# Patient Record
Sex: Female | Born: 1980 | Race: Black or African American | Hispanic: No | Marital: Married | Smoking: Never smoker
Health system: Southern US, Community
[De-identification: ages and names within clinical notes are randomized; demographics above are authoritative.]

## PROBLEM LIST (undated history)

## (undated) ENCOUNTER — Inpatient Hospital Stay (HOSPITAL_COMMUNITY): Payer: Self-pay

## (undated) DIAGNOSIS — F419 Anxiety disorder, unspecified: Secondary | ICD-10-CM

## (undated) DIAGNOSIS — O0990 Supervision of high risk pregnancy, unspecified, unspecified trimester: Secondary | ICD-10-CM

## (undated) DIAGNOSIS — O4690 Antepartum hemorrhage, unspecified, unspecified trimester: Secondary | ICD-10-CM

## (undated) DIAGNOSIS — B181 Chronic viral hepatitis B without delta-agent: Principal | ICD-10-CM

## (undated) HISTORY — DX: Chronic viral hepatitis B without delta-agent: B18.1

## (undated) HISTORY — DX: Antepartum hemorrhage, unspecified, unspecified trimester: O46.90

## (undated) HISTORY — DX: Anxiety disorder, unspecified: F41.9

## (undated) HISTORY — DX: Supervision of high risk pregnancy, unspecified, unspecified trimester: O09.90

---

## 2012-04-26 ENCOUNTER — Inpatient Hospital Stay (HOSPITAL_COMMUNITY): Payer: Self-pay

## 2012-04-26 ENCOUNTER — Inpatient Hospital Stay (HOSPITAL_COMMUNITY)
Admission: AD | Admit: 2012-04-26 | Discharge: 2012-04-26 | Disposition: A | Discharge status: Home / Self Care | Payer: Self-pay | Source: Ambulatory Visit | Attending: Obstetrics and Gynecology | Admitting: Obstetrics and Gynecology

## 2012-04-26 ENCOUNTER — Encounter (HOSPITAL_COMMUNITY): Payer: Self-pay | Admitting: *Deleted

## 2012-04-26 DIAGNOSIS — O209 Hemorrhage in early pregnancy, unspecified: Secondary | ICD-10-CM | POA: Insufficient documentation

## 2012-04-26 DIAGNOSIS — O418X90 Other specified disorders of amniotic fluid and membranes, unspecified trimester, not applicable or unspecified: Secondary | ICD-10-CM

## 2012-04-26 LAB — WET PREP, GENITAL
Clue Cells Wet Prep HPF POC: NONE SEEN
Trich, Wet Prep: NONE SEEN
Yeast Wet Prep HPF POC: NONE SEEN

## 2012-04-26 LAB — CBC WITH DIFFERENTIAL/PLATELET
HCT: 34.7 % — ABNORMAL LOW (ref 36.0–46.0)
Hemoglobin: 11.6 g/dL — ABNORMAL LOW (ref 12.0–15.0)
Lymphocytes Relative: 31 % (ref 12–46)
MCHC: 33.4 g/dL (ref 30.0–36.0)
Monocytes Absolute: 0.4 10*3/uL (ref 0.1–1.0)
Monocytes Relative: 7 % (ref 3–12)
Neutro Abs: 3.4 10*3/uL (ref 1.7–7.7)
WBC: 5.8 10*3/uL (ref 4.0–10.5)

## 2012-04-26 LAB — URINALYSIS, ROUTINE W REFLEX MICROSCOPIC
Bilirubin Urine: NEGATIVE
Glucose, UA: NEGATIVE mg/dL
Hgb urine dipstick: NEGATIVE
Ketones, ur: NEGATIVE mg/dL
Leukocytes, UA: NEGATIVE
Nitrite: NEGATIVE
Protein, ur: NEGATIVE mg/dL
Specific Gravity, Urine: 1.01 (ref 1.005–1.030)
Urobilinogen, UA: 0.2 mg/dL (ref 0.0–1.0)
pH: 6.5 (ref 5.0–8.0)

## 2012-04-26 LAB — POCT PREGNANCY, URINE: Preg Test, Ur: POSITIVE — AB

## 2012-04-26 LAB — ABO/RH: ABO/RH(D): AB POS

## 2012-04-26 LAB — HCG, QUANTITATIVE, PREGNANCY: hCG, Beta Chain, Quant, S: 69364 m[IU]/mL — ABNORMAL HIGH

## 2012-04-26 NOTE — MAU Provider Note (Signed)
History     CSN: [id number]  Arrival date and time: 04/26/12 4098   None     Chief Complaint  Patient presents with  . Vaginal Bleeding   HPI Vicki Allen is a 31 y.o. female who presents to MAU with vaginal bleeding. The bleeding started today.  She describes the bleeding as spotting. She denies abdominal pain. She reports nausea but no vomiting. Last pap smear = never. The history was provided by the patient.  OB History    Grav Para Term Preterm Abortions TAB SAB Ect Mult Living   1         0      History reviewed. No pertinent past medical history.  History reviewed. No pertinent past surgical history.  History reviewed. No pertinent family history.  History  Substance Use Topics  . Smoking status: Never Smoker   . Smokeless tobacco: Never Used  . Alcohol Use: No    Allergies: Allergies not on file  No prescriptions prior to admission    Review of Systems  Constitutional: Negative for fever, chills and weight loss.  HENT: Negative for ear pain, nosebleeds, congestion, sore throat and neck pain.   Eyes: Negative for blurred vision, double vision, photophobia and pain.  Respiratory: Negative for cough, shortness of breath and wheezing.   Cardiovascular: Negative for chest pain, palpitations and leg swelling.  Gastrointestinal: Positive for nausea and constipation. Negative for heartburn, vomiting, abdominal pain and diarrhea.  Genitourinary: Negative for dysuria, urgency and frequency.       Vaginal bleeding (spotting)  Musculoskeletal: Negative for myalgias and back pain.  Skin: Negative for itching and rash.  Neurological: Negative for dizziness, sensory change, speech change, seizures, weakness and headaches.  Endo/Heme/Allergies: Does not bruise/bleed easily.  Psychiatric/Behavioral: Negative for depression. The patient is not nervous/anxious.    Blood pressure 108/69, pulse 79, temperature 96.7 F (35.9 C), temperature source Oral, resp. rate 16,  last menstrual period 03/10/2012.  Physical Exam  Nursing note and vitals reviewed. Constitutional: She is oriented to person, place, and time. She appears well-developed and well-nourished. No distress.  HENT:  Head: Normocephalic and atraumatic.  Eyes: EOM are normal.  Neck: Neck supple.  Cardiovascular: Normal rate.   Respiratory: Effort normal.  GI: Soft. There is no tenderness.  Genitourinary:       External genitalia without lesions. Scant blood vaginal vault. Cervix long, closed, no CMT, no adnexal tenderness. Uterus slightly enlarged.  Musculoskeletal: Normal range of motion.  Neurological: She is alert and oriented to person, place, and time.  Skin: Skin is warm and dry.  Psychiatric: She has a normal mood and affect. Her behavior is normal. Judgment and thought content normal.    Results for orders placed during the hospital encounter of 04/26/12 (from the past 24 hour(s))  URINALYSIS, ROUTINE W REFLEX MICROSCOPIC     Status: Normal   Collection Time   04/26/12  9:40 AM      Component Value Range   Color, Urine YELLOW  YELLOW   APPearance CLEAR  CLEAR   Specific Gravity, Urine 1.010  1.005 - 1.030   pH 6.5  5.0 - 8.0   Glucose, UA NEGATIVE  NEGATIVE mg/dL   Hgb urine dipstick NEGATIVE  NEGATIVE   Bilirubin Urine NEGATIVE  NEGATIVE   Ketones, ur NEGATIVE  NEGATIVE mg/dL   Protein, ur NEGATIVE  NEGATIVE mg/dL   Urobilinogen, UA 0.2  0.0 - 1.0 mg/dL   Nitrite NEGATIVE  NEGATIVE   Leukocytes,  UA NEGATIVE  NEGATIVE  CBC WITH DIFFERENTIAL     Status: Abnormal   Collection Time   04/26/12  9:50 AM      Component Value Range   WBC 5.8  4.0 - 10.5 K/uL   RBC 3.92  3.87 - 5.11 MIL/uL   Hemoglobin 11.6 (*) 12.0 - 15.0 g/dL   HCT 16.1 (*) 09.6 - 04.5 %   MCV 88.5  78.0 - 100.0 fL   MCH 29.6  26.0 - 34.0 pg   MCHC 33.4  30.0 - 36.0 g/dL   RDW 40.9 (*) 81.1 - 91.4 %   Platelets 356  150 - 400 K/uL   Neutrophils Relative 59  43 - 77 %   Neutro Abs 3.4  1.7 - 7.7 K/uL    Lymphocytes Relative 31  12 - 46 %   Lymphs Abs 1.8  0.7 - 4.0 K/uL   Monocytes Relative 7  3 - 12 %   Monocytes Absolute 0.4  0.1 - 1.0 K/uL   Eosinophils Relative 2  0 - 5 %   Eosinophils Absolute 0.1  0.0 - 0.7 K/uL   Basophils Relative 1  0 - 1 %   Basophils Absolute 0.0  0.0 - 0.1 K/uL  HCG, QUANTITATIVE, PREGNANCY     Status: Abnormal   Collection Time   04/26/12  9:50 AM      Component Value Range   hCG, Beta Chain, Quant, [place] [address] (*) <5 mIU/mL  POCT PREGNANCY, URINE     Status: Abnormal   Collection Time   04/26/12 10:04 AM      Component Value Range   Preg Test, Ur POSITIVE (*) NEGATIVE  WET PREP, GENITAL     Status: Abnormal   Collection Time   04/26/12 10:17 AM      Component Value Range   Yeast Wet Prep HPF POC NONE SEEN  NONE SEEN   Trich, Wet Prep NONE SEEN  NONE SEEN   Clue Cells Wet Prep HPF POC NONE SEEN  NONE SEEN   WBC, Wet Prep HPF POC FEW (*) NONE SEEN    Procedures   Assessment: 31 y.o. female with vaginal bleeding @ [redacted]w[redacted]d gestation   Viable IUP on ultrasound   [facility]  Plan:  Start parental care   Pelvic rest   Instructions on threatened AB I have reviewed this patient's vital signs, nurses notes, appropriate labs and imaging.   [place], [facility]  Home Medication Instructions [id number]   Printed on:04/26/12 1917  Medication Information                    Multiple Vitamin (MULTIVITAMIN WITH MINERALS) TABS Take 1 tablet by mouth daily.             NEESE,HOPE, RN, FNP, [facility] 04/26/2012, 10:03 AM

## 2012-04-26 NOTE — MAU Note (Signed)
Bleeding since yesterday, +HPT 2 weeks ago.  Denies pain.

## 2012-04-27 LAB — GC/CHLAMYDIA PROBE AMP: GC Probe RNA: NEGATIVE

## 2012-04-27 NOTE — MAU Provider Note (Signed)
Attestation of Attending Supervision of Advanced Practitioner (CNM/NP): Evaluation and management procedures were performed by the Advanced Practitioner under my supervision and collaboration.  I have reviewed the Advanced Practitioner's note and chart, and I agree with the management and plan.  Chirag Krueger 04/27/2012 7:26 AM

## 2013-03-01 ENCOUNTER — Encounter (HOSPITAL_COMMUNITY): Payer: Self-pay | Admitting: *Deleted

## 2014-03-12 ENCOUNTER — Encounter (HOSPITAL_COMMUNITY): Payer: Self-pay | Admitting: *Deleted

## 2015-04-11 LAB — OB RESULTS CONSOLE RUBELLA ANTIBODY, IGM: RUBELLA: IMMUNE

## 2015-04-26 ENCOUNTER — Encounter (HOSPITAL_COMMUNITY): Payer: Self-pay

## 2015-04-26 ENCOUNTER — Ambulatory Visit (HOSPITAL_COMMUNITY)
Admission: RE | Admit: 2015-04-26 | Discharge: 2015-04-26 | Disposition: A | Discharge status: Home / Self Care | Payer: Managed Care, Other (non HMO) | Source: Ambulatory Visit | Attending: Obstetrics and Gynecology | Admitting: Obstetrics and Gynecology

## 2015-04-26 VITALS — BP 130/74 | HR 111 | Wt 124.0 lb

## 2015-04-26 DIAGNOSIS — B181 Chronic viral hepatitis B without delta-agent: Secondary | ICD-10-CM | POA: Diagnosis not present

## 2015-04-26 DIAGNOSIS — O98412 Viral hepatitis complicating pregnancy, second trimester: Secondary | ICD-10-CM | POA: Diagnosis present

## 2015-04-26 DIAGNOSIS — Z3A17 17 weeks gestation of pregnancy: Secondary | ICD-10-CM

## 2015-04-26 NOTE — Progress Notes (Signed)
34 year old, G2P1001, at 17 weeks 2 days gestation by LMP of 12/26/2014 and EDD of 10/02/2015 for consultation about Hepatitis B. She notes that she was diagnosed with this in 2007 and had it with her son who did not get infected. She is without symptoms currently.   PMHX  negative PSHX  negative POBHX  12/14/2012 40 weeks 9# 8oz  M She reported no complications SocHX  No tobacco, alcohol, illicit drugs or history of STD or abnormal paps Medications  PNV 1 orally qD Allergies   NKDA  #1 Chronic Hepatitis B viral infection - Hepatitis B surface Ag positive, Hepatitis BE NR, Hepatitis C negative, Hepatitis B core antibody NR, AST 14, ALT 7 - Pregnancy is generally well-tolerated in women with chronic hepatitis B virus (HBV) infection who do not have advanced liver disease. However, pregnancy is considered to be an immune tolerant state and is associated with high levels of adrenal corticosteroids that may modulate immune response. Thus, the following clinical manifestations may be seen in pregnant women with chronic HBV: The immunological changes during pregnancy and the postpartum period have been associated with hepatitis flares (including hepatic decompensation), although flares with serious clinical sequelae appear to be uncommon. During the postpartum period, flares may be related to immune reconstitution, a situation immunologically analogous to flares that have been described following the withdrawal of corticosteroids in nonpregnant patients with chronic HBV. Predictors of HBV flares during pregnancy have not been established. However, flares appear to be more common in women who are hepatitis B e antigen (HBeAg)-positive. In addition, flares have been associated with HBeAg seroconversion in approximately 12 to 17 percent of patients, a rate similar to what has been described in patients who are not pregnant. The immunologic, metabolic, and hemodynamic changes that occur during pregnancy have the  potential to worsen or unmask underlying liver disease. Although progression to cirrhosis is not expected within such a short time for most patients, decompensation can occur in the setting of a severe flare. Physical examination may reveal findings suggestive of stigmata of chronic liver disease such as palmar erythema, lower extremity edema, and spider angioma. The immunologic changes associated with pregnancy also have the potential to increase HBV viremia; however, most studies have found that HBV DNA levels remain stable during pregnancy. For mothers with chronic HBV, the impact of HBV infection on newborns is not well defined and data are conflicting. Some studies have found possible associations between chronic HBV and gestational diabetes mellitus, increased risk of prematurity, lower birth weight, and antepartum hemorrhage.  - Management - Various factors need to be assessed when determining the management of pregnant women with chronic HBV during pregnancy, including the indications for treatment, the anticipated duration of therapy, the potential adverse effects to the fetus, the risk of developing drug resistance, and the accessibility and cost of the antiviral agents. The health of the mother and fetus must be considered independently when deciding on treatment. Pregnant women with chronic HBV should be managed in conjunction with a hepatologist.  Women who are pregnant - Some women with chronic HBV require antiviral therapy to prevent progression of liver disease (eg, those with immune-active hepatitis), while others can be observed. The decision to initiate therapy while pregnant depends upon the presence or absence of cirrhosis, HBeAg, and hepatitis B e antibody (anti-HBe), as well as the HBV DNA and aminotransferase levels. The indications for antiviral therapy are generally the same as those for patients who are not pregnant; however, some scenarios may  differ. Antiviral therapy is recommended  for most patients with an ALT >2x the upper limit of normal, women without evidence of cirrhosis may choose to defer therapy until after delivery if they have low viral loads and have mild disease activity (eg, aminotransferase levels just above the treatment threshold). Women with high viral loads should initiate therapy in the third trimester, even if the aminotransferase levels are normal, to prevent transmission to their child. Tenofovir disoproxil fumarate is preferred if antiviral therapy is contemplated in pregnant women because of its potency, safety profile, and low risk of resistance.  - Women who are not on antiviral therapy during pregnancy should be monitored closely to evaluate for a flare. Obtain liver biochemical tests every three months during pregnancy and for six months postpartum. HBV DNA should be tested concurrently or when there is ALT elevation. In addition, the HBV DNA should be measured at 26 to 28 weeks to determine if antiviral therapy should be offered to reduce the risk of mother-to-child transmission.  - Breastfeeding - Infants who received hepatitis B immune globulin (HBIG) and the first dose of hepatitis B vaccine at birth can be breastfed. However, it is important that the infant complete the hepatitis B vaccine series. Mothers with chronic hepatitis B who are breastfeeding should also exercise care to prevent bleeding from cracked nipples. Carrier mothers should not participate in donating breast milk. Discussions of breastfeeding and HBV transmission and newborn immunization are found below.  - Transplacental transmission and transmission due to obstetrical procedures are infrequent causes, and breastfeeding does not appear to pose a substantial risk. In addition, the benefit of cesarean delivery in protecting against transmission has not been clearly established. Thus, the obstetrical approach should not be influenced by the HBV status of the mother. The risk of HBV  transmission is rare when maternal HBV DNA is <105 to 106 int. units/mL. Transmission following amniocentesis has been described, but the risk appears to be low, particularly if the mother is HBeAg-negative with a low HBV viral load, and the procedure is done using a 22-gauge needle under continuous guidance . In an illustrative study, women with HBV who underwent amniocentesis had a rate of vertical transmission that did not differ significantly from women with HBV who did not undergo amniocentesis (9 versus 11 percent). The effect of other invasive procedures during pregnancy (eg, chorionic villus sampling, cordocentesis, fetal surgery) on the risk of transmission is unknown. There are limited data that have examined preterm premature rupture of membranes as a risk factor for HBV transmission, and the available data are conflicting. As a result, management of such patients should not differ from that of women with chronic HBV without preterm premature rupture of membranes. The benefit of cesarean delivery in protecting against HBV transmission has not been clearly established in well-conducted controlled trials. Thus, cesarean delivery should not be routinely recommended for carrier mothers for the purpose of reducing HBV transmission.  Women who are HBsAg-positive should have further testing to measure baseline HBeAg, hepatitis B e antibody (anti-HBe), HBV DNA, and aminotransferase levels. Women who have a high HBV DNA (ie, >2x105 int. units/mL or >106 copies/mL), elevated aminotransferase levels, and/or a positive HBeAg should be referred to a hepatologist to see if early initiation of antiviral medications is needed.  #2 Advanced maternal age - patient has received counseling by primary provider who as offered testing  Questions appear answered to her satisfaction. Precautions for the above given. Spent > 1/2 of 35 minute visit face to face  counseling

## 2015-05-12 NOTE — L&D Delivery Note (Signed)
  Delivery Note At 9:35 AM a viable female "Windell MouldingRuth" was delivered via Vaginal,  Precipitous Spontaneous Delivery (Presentation: ;  ) in maternity admission  With the assistance of the MAU RN.   I was called to the MAU and notified of precipitous delivery.     APGAR: 7, 9; weight 7 lb 3.5 oz (3275 g).   Placenta status: Intact, Spontaneous.  Cord: 3 vessels with the following complications: None.  Cord pH: none  Anesthesia: None  Episiotomy: None Lacerations: 2nd degree;Periurethral Suture Repair: 3.0 vicryl Est. Blood Loss (mL): 300  Mom to postpartum.  Baby to Couplet care / Skin to Skin.  Alphonzo SeveranceRachel Holman Bonsignore 09/28/2015, 3:40 PM

## 2015-05-31 ENCOUNTER — Other Ambulatory Visit (HOSPITAL_COMMUNITY): Payer: Self-pay | Admitting: Obstetrics and Gynecology

## 2015-05-31 DIAGNOSIS — Q672 Dolichocephaly: Secondary | ICD-10-CM

## 2015-06-03 ENCOUNTER — Encounter: Payer: Self-pay | Admitting: Infectious Disease

## 2015-06-03 ENCOUNTER — Ambulatory Visit (INDEPENDENT_AMBULATORY_CARE_PROVIDER_SITE_OTHER): Payer: Managed Care, Other (non HMO) | Admitting: Infectious Disease

## 2015-06-03 DIAGNOSIS — B181 Chronic viral hepatitis B without delta-agent: Secondary | ICD-10-CM | POA: Diagnosis not present

## 2015-06-03 DIAGNOSIS — O0991 Supervision of high risk pregnancy, unspecified, first trimester: Secondary | ICD-10-CM | POA: Diagnosis not present

## 2015-06-03 DIAGNOSIS — O0990 Supervision of high risk pregnancy, unspecified, unspecified trimester: Secondary | ICD-10-CM

## 2015-06-03 HISTORY — DX: Chronic viral hepatitis B without delta-agent: B18.1

## 2015-06-03 HISTORY — DX: Supervision of high risk pregnancy, unspecified, unspecified trimester: O09.90

## 2015-06-03 LAB — COMPLETE METABOLIC PANEL WITH GFR
ALBUMIN: 3.8 g/dL (ref 3.6–5.1)
ALT: 9 U/L (ref 6–29)
AST: 13 U/L (ref 10–30)
Alkaline Phosphatase: 47 U/L (ref 33–115)
BILIRUBIN TOTAL: 0.2 mg/dL (ref 0.2–1.2)
BUN: 8 mg/dL (ref 7–25)
CALCIUM: 9.2 mg/dL (ref 8.6–10.2)
CO2: 21 mmol/L (ref 20–31)
CREATININE: 0.52 mg/dL (ref 0.50–1.10)
Chloride: 104 mmol/L (ref 98–110)
GFR, Est Non African American: >89 mL/min (ref >=60)
Glucose, Bld: 88 mg/dL (ref 65–99)
Potassium: 4 mmol/L (ref 3.5–5.3)
Sodium: 137 mmol/L (ref 135–146)
Total Protein: 6.4 g/dL (ref 6.1–8.1)

## 2015-06-03 NOTE — Progress Notes (Signed)
Consult: Chronic hepatitis B without delta agent and without hepatic coma during high risk pregnancy  Requesting Physician: Elane Fritz NP  Subjective:    Patient ID: Vicki Allen, female    DOB: 12-26-1980, 35 y.o.   MRN: [id number]  HPI  35 year old lady from [place] who was diagnosed with Hepatitis B while still in her native country. She is approximately [redacted] weeks pregnant with 2nd child and accompanied by her 52 year old. She delivered 35 year old previously vaginally and without transmission of Hep B to her son.   She is uncertain of how she contracted the virus but given area of the [place] it is highly likely that it was congenitally contracted. She has zero history of IVDU.   Past Medical History  Diagnosis Date  . Chronic hepatitis B without delta agent without cirrhosis (HCC) 06/03/2015    History reviewed. No pertinent past surgical history. No surgery  Family History  Problem Relation Age of Onset  . Hypertension Mother       Social History   Social History  . Marital Status: Married    Spouse Name: N/A  . Number of Children: N/A  . Years of Education: N/A   Social History Main Topics  . Smoking status: Never Smoker   . Smokeless tobacco: Never Used  . Alcohol Use: No  . Drug Use: No  . Sexual Activity: Not Asked   Other Topics Concern  . None   Social History Narrative    No Known Allergies   Current outpatient prescriptions:  Marland Kitchen  Multiple Vitamin (MULTIVITAMIN WITH MINERALS) TABS, Take 1 tablet by mouth daily., Disp: , Rfl:     Review of Systems  Constitutional: Negative for fever, chills, diaphoresis, activity change, appetite change, fatigue and unexpected weight change.  HENT: Negative for congestion, rhinorrhea, sinus pressure, sneezing, sore throat and trouble swallowing.   Eyes: Negative for photophobia and visual disturbance.  Respiratory: Negative for cough, chest tightness, shortness of breath, wheezing and stridor.   Cardiovascular:  Negative for chest pain, palpitations and leg swelling.  Gastrointestinal: Negative for nausea, vomiting, abdominal pain, diarrhea, constipation, blood in stool, abdominal distention and anal bleeding.  Genitourinary: Negative for dysuria, hematuria, flank pain and difficulty urinating.  Musculoskeletal: Negative for myalgias, back pain, joint swelling, arthralgias and gait problem.  Skin: Negative for color change, pallor, rash and wound.  Neurological: Negative for dizziness, tremors, weakness and light-headedness.  Hematological: Negative for adenopathy. Does not bruise/bleed easily.  Psychiatric/Behavioral: Negative for behavioral problems, confusion, sleep disturbance, dysphoric mood, decreased concentration and agitation.       Objective:   Physical Exam  Constitutional: She is oriented to person, place, and time. She appears well-developed and well-nourished. No distress.  HENT:  Head: Normocephalic and atraumatic.  Mouth/Throat: No oropharyngeal exudate.  Eyes: Conjunctivae and EOM are normal. No scleral icterus.  Neck: Normal range of motion. Neck supple.  Cardiovascular: Normal rate and regular rhythm.   Pulmonary/Chest: Effort normal. No respiratory distress. She has no wheezes.  Abdominal: Soft. Bowel sounds are normal. There is no tenderness. There is no rebound.    Musculoskeletal: She exhibits no edema or tenderness.  Neurological: She is alert and oriented to person, place, and time. She exhibits normal muscle tone. Coordination normal.  Skin: Skin is warm and dry. No rash noted. She is not diaphoretic. No erythema. No pallor.  Psychiatric: She has a normal mood and affect. Her behavior is normal. Judgment and thought content normal.  Nursing note  and vitals reviewed.         Assessment & Plan:   Chronic hepatitis B without delta agent with high risk pregnancy  --check Hepatitis B DNA, recheck LFT's hepatitis E antigen, hepatitis E antigen antibody, delta  agent  We will need to first decide if she needs treatment with Tenofovir during THIS pregancy and then monitor chronically to determine whether she will need treatment later on  She will also need to have screening for [facility] in future

## 2015-06-04 LAB — HEPATITIS A ANTIBODY, TOTAL: Hep A Total Ab: REACTIVE — AB

## 2015-06-05 LAB — HEPATITIS B E ANTIBODY: Hepatitis Be Antibody: REACTIVE — AB

## 2015-06-05 LAB — HEPATITIS B E ANTIGEN: HEPATITIS BE ANTIGEN: NONREACTIVE

## 2015-06-06 LAB — HEPATITIS B DNA, ULTRAQUANTITATIVE, PCR
Hepatitis B DNA (Calc): <1.3 Log IU/mL (ref <1.30)
Hepatitis B DNA: <20 IU/mL (ref <20)

## 2015-06-11 LAB — HEPATITIS DELTA VIRUS ANTIGEN: Hepatitis D Antigen: NOT DETECTED

## 2015-06-21 ENCOUNTER — Other Ambulatory Visit (HOSPITAL_COMMUNITY): Payer: Self-pay | Admitting: Obstetrics and Gynecology

## 2015-06-21 ENCOUNTER — Ambulatory Visit (HOSPITAL_COMMUNITY)
Admission: RE | Admit: 2015-06-21 | Discharge: 2015-06-21 | Disposition: A | Discharge status: Home / Self Care | Payer: Managed Care, Other (non HMO) | Source: Ambulatory Visit | Attending: Obstetrics and Gynecology | Admitting: Obstetrics and Gynecology

## 2015-06-21 ENCOUNTER — Encounter (HOSPITAL_COMMUNITY): Payer: Managed Care, Other (non HMO)

## 2015-06-21 DIAGNOSIS — O09522 Supervision of elderly multigravida, second trimester: Secondary | ICD-10-CM

## 2015-06-21 DIAGNOSIS — B1910 Unspecified viral hepatitis B without hepatic coma: Secondary | ICD-10-CM

## 2015-06-21 DIAGNOSIS — O98419 Viral hepatitis complicating pregnancy, unspecified trimester: Secondary | ICD-10-CM

## 2015-06-21 DIAGNOSIS — Q672 Dolichocephaly: Secondary | ICD-10-CM | POA: Diagnosis not present

## 2015-06-21 DIAGNOSIS — O09299 Supervision of pregnancy with other poor reproductive or obstetric history, unspecified trimester: Secondary | ICD-10-CM | POA: Insufficient documentation

## 2015-06-21 DIAGNOSIS — Z3A25 25 weeks gestation of pregnancy: Secondary | ICD-10-CM | POA: Insufficient documentation

## 2015-06-21 DIAGNOSIS — O09292 Supervision of pregnancy with other poor reproductive or obstetric history, second trimester: Secondary | ICD-10-CM

## 2015-06-21 DIAGNOSIS — Z3689 Encounter for other specified antenatal screening: Secondary | ICD-10-CM

## 2015-06-21 DIAGNOSIS — O359XX0 Maternal care for (suspected) fetal abnormality and damage, unspecified, not applicable or unspecified: Secondary | ICD-10-CM | POA: Insufficient documentation

## 2015-06-21 DIAGNOSIS — Z36 Encounter for antenatal screening of mother: Secondary | ICD-10-CM | POA: Diagnosis present

## 2015-07-03 ENCOUNTER — Ambulatory Visit: Payer: Managed Care, Other (non HMO) | Admitting: Infectious Diseases

## 2015-07-10 ENCOUNTER — Encounter: Payer: Self-pay | Admitting: Infectious Disease

## 2015-07-10 ENCOUNTER — Ambulatory Visit (INDEPENDENT_AMBULATORY_CARE_PROVIDER_SITE_OTHER): Payer: Managed Care, Other (non HMO) | Admitting: Infectious Disease

## 2015-07-10 VITALS — BP 104/67 | Temp 97.9°F | Wt 134.0 lb

## 2015-07-10 DIAGNOSIS — B181 Chronic viral hepatitis B without delta-agent: Secondary | ICD-10-CM | POA: Diagnosis not present

## 2015-07-10 DIAGNOSIS — O0993 Supervision of high risk pregnancy, unspecified, third trimester: Secondary | ICD-10-CM | POA: Diagnosis not present

## 2015-07-10 LAB — COMPLETE METABOLIC PANEL WITH GFR
ALBUMIN: 3.6 g/dL (ref 3.6–5.1)
ALK PHOS: 56 U/L (ref 33–115)
ALT: 7 U/L (ref 6–29)
AST: 13 U/L (ref 10–30)
BILIRUBIN TOTAL: 0.3 mg/dL (ref 0.2–1.2)
BUN: 5 mg/dL — AB (ref 7–25)
CO2: 24 mmol/L (ref 20–31)
Calcium: 8.6 mg/dL (ref 8.6–10.2)
Chloride: 104 mmol/L (ref 98–110)
Creat: 0.5 mg/dL (ref 0.50–1.10)
GFR, Est African American: >89 mL/min (ref >=60)
GLUCOSE: 79 mg/dL (ref 65–99)
Potassium: 3.7 mmol/L (ref 3.5–5.3)
SODIUM: 136 mmol/L (ref 135–146)
TOTAL PROTEIN: 6.3 g/dL (ref 6.1–8.1)

## 2015-07-10 LAB — HIV ANTIBODY (ROUTINE TESTING W REFLEX): HIV 1&2 Ab, 4th Generation: NONREACTIVE

## 2015-07-10 NOTE — Progress Notes (Signed)
Chief complaint: followup for HBV infection in pregnant pt   Subjective:    Patient ID: Vicki Allen, female    DOB: 01-10-1981, 35 y.o.   MRN: [id number]  HPI   35 year old lady from [place] who was diagnosed with Hepatitis B while still in her native country. She had been  approximately [redacted] weeks pregnant with 2nd child and accompanied  When I saw her last.  I checked HBV DNA and it was undetectable . Her Hep BE Ag was negative and hep B e ag ab +.  LFT's were normal.   She is without any complaints today and in good spirits.   Past Medical History  Diagnosis Date  . Chronic hepatitis B without delta agent without cirrhosis (HCC) 06/03/2015  . High-risk pregnancy 06/03/2015    No past surgical history on file. No surgery  Family History  Problem Relation Age of Onset  . Hypertension Mother       Social History   Social History  . Marital Status: Married    Spouse Name: N/A  . Number of Children: N/A  . Years of Education: N/A   Social History Main Topics  . Smoking status: Never Smoker   . Smokeless tobacco: Never Used  . Alcohol Use: No  . Drug Use: No  . Sexual Activity: Not on file   Other Topics Concern  . Not on file   Social History Narrative    No Known Allergies   Current outpatient prescriptions:  Marland Kitchen  Multiple Vitamin (MULTIVITAMIN WITH MINERALS) TABS, Take 1 tablet by mouth daily., Disp: , Rfl:     Review of Systems  Constitutional: Negative for fever, chills, diaphoresis, activity change, appetite change, fatigue and unexpected weight change.  HENT: Negative for congestion, rhinorrhea, sinus pressure, sneezing, sore throat and trouble swallowing.   Eyes: Negative for photophobia and visual disturbance.  Respiratory: Negative for cough, chest tightness, shortness of breath, wheezing and stridor.   Cardiovascular: Negative for chest pain, palpitations and leg swelling.  Gastrointestinal: Negative for nausea, vomiting, abdominal pain, diarrhea,  constipation, blood in stool, abdominal distention and anal bleeding.  Genitourinary: Negative for dysuria, hematuria, flank pain and difficulty urinating.  Musculoskeletal: Negative for myalgias, back pain, joint swelling, arthralgias and gait problem.  Skin: Negative for color change, pallor, rash and wound.  Neurological: Negative for dizziness, tremors, weakness and light-headedness.  Hematological: Negative for adenopathy. Does not bruise/bleed easily.  Psychiatric/Behavioral: Negative for behavioral problems, confusion, sleep disturbance, dysphoric mood, decreased concentration and agitation.       Objective:   Physical Exam  Constitutional: She is oriented to person, place, and time. She appears well-developed and well-nourished. No distress.  HENT:  Head: Normocephalic and atraumatic.  Mouth/Throat: No oropharyngeal exudate.  Eyes: Conjunctivae and EOM are normal. No scleral icterus.  Neck: Normal range of motion. Neck supple.  Cardiovascular: Normal rate and regular rhythm.   Pulmonary/Chest: Effort normal. No respiratory distress. She has no wheezes.  Abdominal: Soft. Bowel sounds are normal. There is no tenderness. There is no rebound.    Musculoskeletal: She exhibits no edema or tenderness.  Neurological: She is alert and oriented to person, place, and time. She exhibits normal muscle tone. Coordination normal.  Skin: Skin is warm and dry. No rash noted. She is not diaphoretic. No erythema. No pallor.  Psychiatric: She has a normal mood and affect. Her behavior is normal. Judgment and thought content normal.  Nursing note and vitals reviewed.  Assessment & Plan:   Chronic hepatitis B without delta agent with high risk pregnancy  --check Hepatitis B DNA AGAIN,  recheck LFT's hepatitis Surface  Antigen, surface antibody. I wonder if she has in fact cleared her hep B  She does not look likely to require any treatment during pregnancy  She will followup in  next 2 months

## 2015-07-11 LAB — HEPATITIS B SURF AG CONFIRMATION: HEPATITIS B SURFACE ANTIGEN CONFIRMATION: POSITIVE — AB

## 2015-07-11 LAB — HEPATITIS B SURFACE ANTIBODY,QUALITATIVE: HEP B S AB: NEGATIVE

## 2015-07-11 LAB — HEPATITIS B SURFACE ANTIGEN: Hepatitis B Surface Ag: POSITIVE — AB

## 2015-07-16 LAB — HEPATITIS B DNA, ULTRAQUANTITATIVE, PCR: Hepatitis B DNA: <20 IU/mL (ref <20)

## 2015-09-10 ENCOUNTER — Ambulatory Visit: Payer: Managed Care, Other (non HMO) | Admitting: Infectious Disease

## 2015-09-23 ENCOUNTER — Ambulatory Visit (INDEPENDENT_AMBULATORY_CARE_PROVIDER_SITE_OTHER): Payer: Managed Care, Other (non HMO) | Admitting: Infectious Disease

## 2015-09-23 ENCOUNTER — Encounter: Payer: Self-pay | Admitting: Infectious Disease

## 2015-09-23 VITALS — BP 112/78 | HR 111 | Temp 98.2°F | Wt 147.0 lb

## 2015-09-23 DIAGNOSIS — O0993 Supervision of high risk pregnancy, unspecified, third trimester: Secondary | ICD-10-CM

## 2015-09-23 DIAGNOSIS — O4690 Antepartum hemorrhage, unspecified, unspecified trimester: Secondary | ICD-10-CM

## 2015-09-23 DIAGNOSIS — B181 Chronic viral hepatitis B without delta-agent: Secondary | ICD-10-CM | POA: Diagnosis not present

## 2015-09-23 HISTORY — DX: Antepartum hemorrhage, unspecified, unspecified trimester: O46.90

## 2015-09-23 LAB — CBC WITH DIFFERENTIAL/PLATELET
BASOS ABS: 0 {cells}/uL (ref 0–200)
Basophils Relative: 0 %
EOS PCT: 1 %
Eosinophils Absolute: 72 cells/uL (ref 15–500)
HCT: 35.9 % (ref 35.0–45.0)
HEMOGLOBIN: 12.2 g/dL (ref 11.7–15.5)
LYMPHS ABS: 1656 {cells}/uL (ref 850–3900)
LYMPHS PCT: 23 %
MCH: 33.2 pg — AB (ref 27.0–33.0)
MCHC: 34 g/dL (ref 32.0–36.0)
MCV: 97.6 fL (ref 80.0–100.0)
MPV: 9.5 fL (ref 7.5–12.5)
Monocytes Absolute: 504 cells/uL (ref 200–950)
Monocytes Relative: 7 %
NEUTROS PCT: 69 %
Neutro Abs: 4968 cells/uL (ref 1500–7800)
Platelets: 255 10*3/uL (ref 140–400)
RBC: 3.68 MIL/uL — ABNORMAL LOW (ref 3.80–5.10)
RDW: 14.3 % (ref 11.0–15.0)
WBC: 7.2 10*3/uL (ref 3.8–10.8)

## 2015-09-23 LAB — COMPLETE METABOLIC PANEL WITH GFR
ALBUMIN: 3.4 g/dL — AB (ref 3.6–5.1)
ALK PHOS: 103 U/L (ref 33–115)
ALT: 7 U/L (ref 6–29)
AST: 14 U/L (ref 10–30)
BILIRUBIN TOTAL: 0.3 mg/dL (ref 0.2–1.2)
BUN: 5 mg/dL — AB (ref 7–25)
CALCIUM: 8.8 mg/dL (ref 8.6–10.2)
CO2: 20 mmol/L (ref 20–31)
CREATININE: 0.54 mg/dL (ref 0.50–1.10)
Chloride: 103 mmol/L (ref 98–110)
GFR, Est African American: >89 mL/min (ref >=60)
GFR, Est Non African American: >89 mL/min (ref >=60)
GLUCOSE: 110 mg/dL — AB (ref 65–99)
POTASSIUM: 4 mmol/L (ref 3.5–5.3)
SODIUM: 137 mmol/L (ref 135–146)
TOTAL PROTEIN: 6.1 g/dL (ref 6.1–8.1)

## 2015-09-23 NOTE — Progress Notes (Signed)
Chief complaint: followup for HBV infection in pregnant pt, she is due next week and is now having some small amount of vaginal bleeding   Subjective:    Patient ID: Vicki Allen, female    DOB: 06/15/1980, [age] y.o.   MRN: [id number]  HPI   35 year old lady from [place] who was diagnosed with Hepatitis B while still in her native country. She had been  approximately [redacted] weeks pregnant with 2nd child and accompanied during our first visit.  I checked HBV DNA and it was undetectable . Her Hep BE Ag was negative and hep B e ag ab +.  LFT's were normal.   I have seen her a 2nd time and yet again normal LFT's and HBV DNA <20.   Today she presents for followup with her child estimated date for delivery being only a week away.  She tells us today that within the past few days she has begun to bleed slightly from her vagina. I have asked her to see her Ob/Gyn asap.    Past Medical History  Diagnosis Date  . Chronic hepatitis B without delta agent without cirrhosis (HCC) 06/03/2015  . High-risk pregnancy 06/03/2015  . Vaginal bleeding during pregnancy, antepartum 09/23/2015    No past surgical history on file. No surgery  Family History  Problem Relation Age of Onset  . Hypertension Mother       Social History   Social History  . Marital Status: Married    Spouse Name: N/A  . Number of Children: N/A  . Years of Education: N/A   Social History Main Topics  . Smoking status: Never Smoker   . Smokeless tobacco: Never Used  . Alcohol Use: No  . Drug Use: No  . Sexual Activity: Not Asked   Other Topics Concern  . None   Social History Narrative    No Known Allergies   Current outpatient prescriptions:  Marland Kitchen.  Multiple Vitamin (MULTIVITAMIN WITH MINERALS) TABS, Take 1 tablet by mouth daily., Disp: , Rfl:     Review of Systems  Constitutional: Negative for fever, chills, diaphoresis, activity change, appetite change, fatigue and unexpected weight change.  HENT: Negative  for congestion, rhinorrhea, sinus pressure, sneezing, sore throat and trouble swallowing.   Eyes: Negative for photophobia and visual disturbance.  Respiratory: Negative for cough, chest tightness, shortness of breath, wheezing and stridor.   Cardiovascular: Negative for chest pain, palpitations and leg swelling.  Gastrointestinal: Negative for nausea, vomiting, abdominal pain, diarrhea, constipation, blood in stool, abdominal distention and anal bleeding.  Genitourinary: Negative for dysuria, hematuria, flank pain and difficulty urinating.  Musculoskeletal: Negative for myalgias, back pain, joint swelling, arthralgias and gait problem.  Skin: Negative for color change, pallor, rash and wound.  Neurological: Negative for dizziness, tremors, weakness and light-headedness.  Hematological: Negative for adenopathy. Does not bruise/bleed easily.  Psychiatric/Behavioral: Negative for behavioral problems, confusion, sleep disturbance, dysphoric mood, decreased concentration and agitation.       Objective:   Physical Exam  Constitutional: She is oriented to person, place, and time. She appears well-developed and well-nourished. No distress.  HENT:  Head: Normocephalic and atraumatic.  Mouth/Throat: No oropharyngeal exudate.  Eyes: Conjunctivae and EOM are normal. No scleral icterus.  Neck: Normal range of motion. Neck supple.  Cardiovascular: Normal rate and regular rhythm.   Pulmonary/Chest: Effort normal. No respiratory distress. She has no wheezes.  Abdominal: Soft. Bowel sounds are normal. There is no tenderness. There is no rebound.    Musculoskeletal:  She exhibits no edema or tenderness.  Neurological: She is alert and oriented to person, place, and time. She exhibits normal muscle tone. Coordination normal.  Skin: Skin is warm and dry. No rash noted. She is not diaphoretic. No erythema. No pallor.  Psychiatric: She has a normal mood and affect. Her behavior is normal. Judgment and  thought content normal.  Nursing note and vitals reviewed.         Assessment & Plan:   Chronic hepatitis B without delta agent with high risk pregnancy  --check Hepatitis B DNA  And recheck LFT's --rtc in 6 months   Vaginal bleeding: she needs to be seen by her Obstetrician.

## 2015-09-25 ENCOUNTER — Inpatient Hospital Stay (HOSPITAL_COMMUNITY)
Admission: AD | Admit: 2015-09-25 | Discharge: 2015-09-25 | Disposition: A | Discharge status: Home / Self Care | Payer: Managed Care, Other (non HMO) | Source: Ambulatory Visit | Attending: Obstetrics & Gynecology | Admitting: Obstetrics & Gynecology

## 2015-09-25 ENCOUNTER — Encounter (HOSPITAL_COMMUNITY): Payer: Self-pay | Admitting: *Deleted

## 2015-09-25 DIAGNOSIS — N812 Incomplete uterovaginal prolapse: Secondary | ICD-10-CM

## 2015-09-25 DIAGNOSIS — N814 Uterovaginal prolapse, unspecified: Secondary | ICD-10-CM

## 2015-09-25 NOTE — MAU Provider Note (Signed)
  History   35 y/o G2P1001 @ [redacted] weeks EGA presenting with chief complaint of something coming out of vagina that she noted after she had had a bowel movement.  Also has had some a bloody show.  She denies constipation.    Patient Active Problem List   Diagnosis Date Noted  . Vaginal bleeding during pregnancy, antepartum 09/23/2015  . Chronic hepatitis B without delta agent without cirrhosis (HCC) 06/03/2015  . High-risk pregnancy 06/03/2015    Chief Complaint  Patient presents with  . Vaginal Prolapse   HPI  OB History    Gravida Para Term Preterm AB TAB SAB Ectopic Multiple Living   2 1 1       1       Past Medical History  Diagnosis Date  . Chronic hepatitis B without delta agent without cirrhosis (HCC) 06/03/2015  . High-risk pregnancy 06/03/2015  . Vaginal bleeding during pregnancy, antepartum 09/23/2015    History reviewed. No pertinent past surgical history.  Family History  Problem Relation Age of Onset  . Hypertension Mother     Social History  Substance Use Topics  . Smoking status: Never Smoker   . Smokeless tobacco: Never Used  . Alcohol Use: No    Allergies: No Known Allergies  Prescriptions prior to admission  Medication Sig Dispense Refill Last Dose  . Multiple Vitamin (MULTIVITAMIN WITH MINERALS) TABS Take 1 tablet by mouth daily.   Taking    ROS  Constitutional: Denies fevers/chills Cardiovascular: Denies chest pain or palpitations Pulmonary: Denies coughing or wheezing Gastrointestinal: Denies nausea, vomiting or diarrhea Genitourinary: Denies pelvic pain, unusual vaginal bleeding, unusual vaginal discharge, dysuria, urgency or frequency. +pelvic pressure.  + vaginal spotting.  Musculoskeletal: Denies muscle or joint aches and pain.  Neurology: Denies abnormal sensations such as tingling or numbness.   Physical Exam   Blood pressure 110/76, pulse 101, temperature 97.8 F (36.6 C), temperature source Oral, resp. rate 16, last menstrual period  12/26/2014.  Gen: No acute distress.  Cervix: 3-4/50%/-3. Cervix prolapsing to 0.5cm posterior to hymen with valsalva.   EFM: 140 BL, mod variability, reactive.  TOCO: + irritability.  Blood group AB positive (office results).    Physical Exam  ED Course Patient was monitored for about 2 hours in ED.  Cervix recheck by RN was unchanged She was deemed stable for discharge   Assessment: Cervical prolapse in pregnancy  Plan: Discharge to home with office follow up in 1 week.  Labor precautions.  Discussed pelvic organ and cervical prolapse management, will follow in postpartum period.    Konrad FelixKULWA,Luisfernando Brightwell WAKURU MD.  09/25/2015 1:51 PM

## 2015-09-25 NOTE — MAU Note (Signed)
Pt presented to MAU by EMS, term pregnancy, states something was "coming out" at home.  EMT reports seeing something - ? Uterus/cervix.  Nothing presenting @ time of pt's arrival.  Dr. Sallye OberKulwa on unit.  Pt reports occasional contractions, no LOF, small amount of bleeding.

## 2015-09-26 LAB — HEPATITIS B DNA, ULTRAQUANTITATIVE, PCR
Hepatitis B DNA (Calc): 2.18 Log IU/mL — ABNORMAL HIGH (ref <1.30)
Hepatitis B DNA: 151 IU/mL — ABNORMAL HIGH (ref <20)

## 2015-09-28 ENCOUNTER — Inpatient Hospital Stay (HOSPITAL_COMMUNITY)
Admission: AD | Admit: 2015-09-28 | Discharge: 2015-09-30 | DRG: 774 | Disposition: A | Discharge status: Home / Self Care | Payer: Managed Care, Other (non HMO) | Source: Ambulatory Visit | Attending: Obstetrics and Gynecology | Admitting: Obstetrics and Gynecology

## 2015-09-28 ENCOUNTER — Encounter (HOSPITAL_COMMUNITY): Payer: Self-pay | Admitting: Certified Nurse Midwife

## 2015-09-28 DIAGNOSIS — Z8249 Family history of ischemic heart disease and other diseases of the circulatory system: Secondary | ICD-10-CM | POA: Diagnosis not present

## 2015-09-28 DIAGNOSIS — O9842 Viral hepatitis complicating childbirth: Secondary | ICD-10-CM | POA: Diagnosis present

## 2015-09-28 DIAGNOSIS — O0930 Supervision of pregnancy with insufficient antenatal care, unspecified trimester: Secondary | ICD-10-CM

## 2015-09-28 DIAGNOSIS — B181 Chronic viral hepatitis B without delta-agent: Secondary | ICD-10-CM | POA: Diagnosis present

## 2015-09-28 DIAGNOSIS — O3443 Maternal care for other abnormalities of cervix, third trimester: Secondary | ICD-10-CM | POA: Diagnosis present

## 2015-09-28 DIAGNOSIS — N812 Incomplete uterovaginal prolapse: Secondary | ICD-10-CM | POA: Diagnosis present

## 2015-09-28 DIAGNOSIS — O09529 Supervision of elderly multigravida, unspecified trimester: Secondary | ICD-10-CM

## 2015-09-28 DIAGNOSIS — Z3A39 39 weeks gestation of pregnancy: Secondary | ICD-10-CM

## 2015-09-28 MED ORDER — WITCH HAZEL-GLYCERIN EX PADS: 1.0000 "application " | MEDICATED_PAD | CUTANEOUS | Status: DC | PRN

## 2015-09-28 MED ORDER — LIDOCAINE HCL (PF) 1 % IJ SOLN
INTRAMUSCULAR | Status: AC
  Filled 2015-09-28: qty 30

## 2015-09-28 MED ORDER — SIMETHICONE 80 MG PO CHEW: 80.0000 mg | CHEWABLE_TABLET | ORAL | Status: DC | PRN

## 2015-09-28 MED ORDER — SENNOSIDES-DOCUSATE SODIUM 8.6-50 MG PO TABS
2.0000 | ORAL_TABLET | ORAL | Status: DC
Start: 2015-09-29 — End: 2015-09-30
  Administered 2015-09-29 (×2): 2 via ORAL
  Filled 2015-09-28 (×2): qty 2

## 2015-09-28 MED ORDER — OXYTOCIN 10 UNIT/ML IJ SOLN
INTRAMUSCULAR | Status: AC
  Filled 2015-09-28: qty 1

## 2015-09-28 MED ORDER — COCONUT OIL OIL: 1.0000 "application " | TOPICAL_OIL | Status: DC | PRN

## 2015-09-28 MED ORDER — TETANUS-DIPHTH-ACELL PERTUSSIS 5-2.5-18.5 LF-MCG/0.5 IM SUSP: 0.5000 mL | Freq: Once | INTRAMUSCULAR | Status: DC

## 2015-09-28 MED ORDER — ONDANSETRON HCL 4 MG PO TABS: 4.0000 mg | ORAL_TABLET | ORAL | Status: DC | PRN

## 2015-09-28 MED ORDER — ACETAMINOPHEN 325 MG PO TABS: 650.0000 mg | ORAL_TABLET | ORAL | Status: DC | PRN

## 2015-09-28 MED ORDER — OXYCODONE-ACETAMINOPHEN 5-325 MG PO TABS: 1.0000 | ORAL_TABLET | ORAL | Status: DC | PRN

## 2015-09-28 MED ORDER — IBUPROFEN 600 MG PO TABS
600.0000 mg | ORAL_TABLET | Freq: Four times a day (QID) | ORAL | Status: DC
  Administered 2015-09-28 – 2015-09-30 (×9): 600 mg via ORAL
  Filled 2015-09-28 (×9): qty 1

## 2015-09-28 MED ORDER — DIPHENHYDRAMINE HCL 25 MG PO CAPS
25.0000 mg | ORAL_CAPSULE | Freq: Four times a day (QID) | ORAL | Status: DC | PRN
Start: 2015-09-28 — End: 2015-09-30

## 2015-09-28 MED ORDER — ZOLPIDEM TARTRATE 5 MG PO TABS: 5.0000 mg | ORAL_TABLET | Freq: Every evening | ORAL | Status: DC | PRN

## 2015-09-28 MED ORDER — OXYCODONE-ACETAMINOPHEN 5-325 MG PO TABS: 2.0000 | ORAL_TABLET | ORAL | Status: DC | PRN

## 2015-09-28 MED ORDER — BENZOCAINE-MENTHOL 20-0.5 % EX AERO
1.0000 "application " | INHALATION_SPRAY | CUTANEOUS | Status: DC | PRN
  Administered 2015-09-28: 1 via TOPICAL
  Filled 2015-09-28: qty 56

## 2015-09-28 MED ORDER — DIBUCAINE 1 % RE OINT: 1.0000 "application " | TOPICAL_OINTMENT | RECTAL | Status: DC | PRN

## 2015-09-28 MED ORDER — PRENATAL MULTIVITAMIN CH
1.0000 | ORAL_TABLET | Freq: Every day | ORAL | Status: DC
  Administered 2015-09-28 – 2015-09-30 (×3): 1 via ORAL
  Filled 2015-09-28 (×3): qty 1

## 2015-09-28 MED ORDER — ONDANSETRON HCL 4 MG/2ML IJ SOLN: 4.0000 mg | INTRAMUSCULAR | Status: DC | PRN

## 2015-09-28 NOTE — Lactation Note (Signed)
This note was copied from a baby's chart. Lactation Consultation Note  Patient Name: Vicki Allen Today's Date: 09/28/2015 Reason for consult: Initial assessment  Baby 8 hours old. Mom nursing baby when this LC entered the room. Mom reports that she nursed her first child for 2 years without any issues. Mom holding baby in a modified cradle position. Baby maintaining a deep latch for over 20 minutes. Enc mom to continue to nurse with cues. [facility], aware of OP/BFSG and LC phone line assistance after D/C. Enc mom to call for assistance as needed.  Maternal Data Has patient been taught Hand Expression?: Yes Does the patient have breastfeeding experience prior to this delivery?: Yes  Feeding Feeding Type: Breast Fed  LATCH Score/Interventions                      Lactation Tools Discussed/Used     Consult Status Consult Status: PRN    Geralynn OchsWILLIARD, Bobbyjoe Pabst 09/28/2015, 6:27 PM

## 2015-09-28 NOTE — MAU Note (Signed)
Pt comes to MAU in labor. Infant delivered in wheelchair upon arrival.

## 2015-09-28 NOTE — Progress Notes (Addendum)
Patient educated upon admission to mother/baby to not get up without our assistance and fall prevention safety plan/form reviewed and signed. Preferred language is English per patient, offered interpreter, patient refused. Upon entering room at 1hour assessment patient was noted to be up in shower by herself, father of baby in room, holding baby. Checked on mom with Signe ColtMichelle Kahn,RN, helped mother out of shower, patient complaining of dizziness, helped patient to commode, patient began fainting at that time. Emergency assistance called, steady used to help patient back to bed. Reeducated patient about getting up alone. Patients VS WNL, alert and oriented x4, fundus U -1 bleeding small. Made Dr.Stringer aware, no new orders at this time.

## 2015-09-28 NOTE — H&P (Signed)
Vicki Allen is a 35 y.o. female, G2P1001 at 39.3 weeks80, presenting for  .  Patient Active Problem List   Diagnosis Date Noted  . Spontaneous vaginal delivery 09/28/2015  . Late prenatal care 09/28/2015  . AMA (advanced maternal age) multigravida 35+ 09/28/2015  . Vaginal bleeding during pregnancy, antepartum 09/23/2015  . Chronic hepatitis B without delta agent without cirrhosis (HCC) 06/03/2015  . High-risk pregnancy 06/03/2015    History of present pregnancy: Patient entered care at 15.1 weeks.   EDC of 10/02/14 was established by LMP of 12/26/14.   Anatomy scan:  20.6 weeks  At 05/21/15, with normal findings and an posterior placenta.  SIUP, vertex, posterior placenta-placental edge is 6.1cm from internal os-normal. Fluid is normal - vertical  pocket=3.8cm, cervix is closed.ovaries and adnexas appear wnl. Palate, profile, nasal bone, philtrum, open  hands, 5th digit, feet, heels seen- head shape apprears dolicocephaly .     Additional US evaluations:     06/21/15: MFM US NORMAL ANATOMY AND HEAD SHAPE   Significant prenatal events:   First Trimester:     Late to Prenatal care  Second Trimester: Entered care at 15-16 wks. Declined genetic testing;  Hep B+ status, F/u with MFM  And referral to ID . Per ID no evidence of active disease, may have cleared the virus;  Hx of LGA infant,early Glucola, Wnl ;  Yeast/ BV present and treated at [facility] Third Trimester:   Normal discomforts of pregnancy including increased pelvic pressure  Last evaluation: 38.1 wks on 09/19/15  S. DevaneJohnson, CNM  FH 38cm, Vertex,  BPM 140 ,  + FM,     1/30/-3;  BP 80/52 ;  wtg 147.5 lbs    OB History    Gravida Para Term Preterm AB TAB SAB Ectopic Multiple Living   2 2 2       0 1     12/14/12   M, 9lbs 8oz, NSVD  Past Medical History  Diagnosis Date  . Chronic hepatitis B without delta agent without cirrhosis (HCC) 06/03/2015  . High-risk pregnancy 06/03/2015  . Vaginal bleeding during pregnancy,  antepartum 09/23/2015   History reviewed. No pertinent past surgical history. Family History: family history includes Hypertension in her mother. Social History:  reports that she has never smoked. She has never used smokeless tobacco. She reports that she does not drink alcohol or use illicit drugs.  Country [place] Origin, [place], Ethnicity, African;   4 year college education, homemaker, catholic, married, heterosexual   Prenatal Transfer Tool  Maternal Diabetes: No Genetic Screening: Declined Maternal Ultrasounds/Referrals: Normal Fetal Ultrasounds or other Referrals:  None, Referred to Materal Fetal Medicine      Hep BE antigen, Hep B core antibody, SEE BY MFM AND ID CLINIC. ADDITIONAL HEPATITIS TESTING NEGATIVE, AND LFTS WNL. FOLLOWED AT ID CLINIC, NEXT CHECK IN 09/2015. NO NEED FOR ANY TREATMENT. ID WILL F/U 09/2015.  Maternal Substance Abuse:  No Significant Maternal Medications:  None Significant Maternal Lab Results: Lab values include: Group B Strep negative  TDAP 07/11/15 Flu    04/22/15  ROS:  All system reviewed and Wnl expect where indicated in HPI  No Known Allergies     Blood pressure 103/60, pulse 100, temperature 98.6 F (37 C), temperature source Oral, resp. rate 16, height 5\' 6"  (1.676 m), weight 67.132 kg (148 lb), last menstrual period 12/26/2014, SpO2 100 %, unknown if currently breastfeeding.  Chest clear Heart RRR without murmur Abd gravid, NT, FH 39 Pelvic: proven to 9lbs 8oz  Ext: wnl   Prenatal labs: ABO, Rh:      AB  positive Antibody:      negative Rubella:  !Error!    Immune RPR:     NR  07/23/15 HBsAg: POSITIVE (03/01 1406)  HIV: NONREACTIVE (03/01 1406)  GBS:    Negative   Sickle cell/Hgb electrophoresis:  AF% Pap:  Wnl  8/17/116 GC:  negative Chlamydia:  negative Genetic screenings:  declined Glucola: wnl Other:    Hep C negative Hgb 11.9  at [facility],  at 28 weeks    Assessment/Plan: IUP at 39.3 Precipitous spontaneous vagina delivery in  Maternity Admissions Unit AMA  Late to prenatal care Positive Viral Hepatitis B  GBS negative  Plan: Admit to Postpartum per consult with Dr. Stefano Gaul Routine [facility] orders    Beatrix Fetters, [place] 09/28/2015, 4:56 PM

## 2015-09-28 NOTE — MAU Provider Note (Signed)
A:  [name] is a 35 y.o. female 82P2001 @ 7332w3d here with labor. The patient was noted to be screaming in the lobby of MAU; I assisted the RN and patient to room 2, SVD of a viable female infant in the wheelchair (delivery time [phone]). Baby was placed immediately on mom for skin-skin care. Baby appearing stunned initially despite stimulation. Baby taken by nursing staff to radiant warmer; NICU called for fetal assessment. Apgars 7,9  Prenatal course unknown, [place]'s midwife called for delivery.  Language barrier present.   Pitocin 40 units IM given Alphonzo Severanceachel Stall CNM at bedside.    Duane LopeJennifer I Rasch, NP 09/28/2015 11:31 AM

## 2015-09-28 NOTE — Progress Notes (Signed)
Vicki Allen, CNM notified that pt delivered in MAU and she needed to come to MAU Room 2.

## 2015-09-29 LAB — CBC
HEMATOCRIT: 28.6 % — AB (ref 36.0–46.0)
HEMOGLOBIN: 9.9 g/dL — AB (ref 12.0–15.0)
MCH: 32.8 pg (ref 26.0–34.0)
MCHC: 34.6 g/dL (ref 30.0–36.0)
MCV: 94.7 fL (ref 78.0–100.0)
Platelets: 223 10*3/uL (ref 150–400)
RBC: 3.02 MIL/uL — AB (ref 3.87–5.11)
RDW: 13.6 % (ref 11.5–15.5)
WBC: 13.8 10*3/uL — AB (ref 4.0–10.5)

## 2015-09-29 LAB — RPR: RPR Ser Ql: NONREACTIVE

## 2015-09-29 MED ORDER — FERROUS SULFATE 325 (65 FE) MG PO TABS
325.0000 mg | ORAL_TABLET | Freq: Every day | ORAL | Status: DC
  Administered 2015-09-30: 325 mg via ORAL
  Filled 2015-09-29 (×2): qty 1

## 2015-09-29 NOTE — Progress Notes (Signed)
Subjective: Postpartum Day 1: Vaginal delivery,2nd degree, periurethral laceration Patient up ad lib, reports no syncope or dizziness. Pain well controlled on motrin Feeding: Breast  Contraceptive plan: undecided  Objective: Vital signs in last 24 hours: Temp:  [97.6 F (36.4 C)-98.6 F (37 C)] 98 F (36.7 C) (05/21 0600) Pulse Rate:  [80-100] 85 (05/21 0600) Resp:  [16-18] 18 (05/21 0600) BP: (95-116)/(59-64) 95/59 mmHg (05/21 0600) SpO2:  [98 %-100 %] 100 % (05/21 0600) Weight:  [67.132 kg (148 lb)] 67.132 kg (148 lb) (05/20 1133)  Physical Exam:  General: alert and cooperative Lochia: appropriate Uterine Fundus: firm Perineum: healing well DVT Evaluation: No evidence of DVT seen on physical exam.   CBC Latest Ref Rng 09/29/2015 09/23/2015 04/26/2012  WBC 4.0 - 10.5 K/uL 13.8(H) 7.2 5.8  Hemoglobin 12.0 - 15.0 g/dL 6.5(H9.9(L) 84.612.2 11.6(L)  Hematocrit 36.0 - 46.0 % 28.6(L) 35.9 34.7(L)  Platelets 150 - 400 K/uL 223 255 356     Assessment/Plan: Status post vaginal delivery day 1. Stable Continue current care. Breastfeeding   Beatrix FettersRachel StallCNM 09/29/2015, 11:02 AM

## 2015-09-30 MED ORDER — IBUPROFEN 600 MG PO TABS
600.0000 mg | ORAL_TABLET | Freq: Four times a day (QID) | ORAL | Status: DC
Start: 2015-09-30 — End: 2016-05-13

## 2015-09-30 NOTE — Discharge Summary (Signed)
Obstetric Discharge Summary Reason for Admission: onset of labor Prenatal Procedures: ultrasound Intrapartum Procedures: spontaneous vaginal delivery Postpartum Procedures: none Complications-Operative and Postpartum: none HEMOGLOBIN  Date Value Ref Range Status  09/29/2015 9.9* 12.0 - 15.0 g/dL Final   HCT  Date Value Ref Range Status  09/29/2015 28.6* 36.0 - 46.0 % Final    Physical Exam:  General: alert and cooperative Lochia: appropriate Uterine Fundus: firm Incision: na DVT Evaluation: No evidence of DVT seen on physical exam.  Discharge Diagnoses: Term Pregnancy-delivered by Alphonzo Severanceachel Stall CNM  By SVD.  Pts pregnancy complicated by chronic Hepatitis B and AMA  Discharge Information: Date: 09/30/2015 Activity: pelvic rest Diet: routine Medications: PNV and Ibuprofen Condition: stable Instructions: refer to practice specific booklet Discharge to: home Follow-up Information    [facility] Schedule an appointment as soon as possible for a visit in 6 weeks.   Specialty:  Obstetrics and Gynecology   Contact information:   [address] [place] [phone]      Newborn Data: Live born female  Birth Weight: 7 lb 3.5 oz (3275 g) APGAR: 7, 9  Home with mother.  Chanler Mendonca A 09/30/2015, 1:11 PM

## 2015-09-30 NOTE — Lactation Note (Signed)
This note was copied from a baby's chart. Lactation Consultation Note  Patient Name: Vicki Allen Today's Date: 09/30/2015 Reason for consult: Follow-up assessment Mom reports some mild nipple tenderness, positional stripes noted bilateral, no cracking or bleeding. Assisted Mom with latch at this visit for baby to obtain more depth. Mom reported less discomfort. Advised to apply EBM to sore nipples, can use coconut oil if needed. Engorgement care reviewed if needed. Continue to BF with feeding ques. Advised of OP services and support group. Encouraged to call for questions/concerns.   Maternal Data    Feeding Feeding Type: Breast Fed Length of feed: 30 min  LATCH Score/Interventions Latch: Grasps breast easily, tongue down, lips flanged, rhythmical sucking. Intervention(s): Adjust position;Assist with latch;Breast massage;Breast compression  Audible Swallowing: A few with stimulation  Type of Nipple: Everted at rest and after stimulation  Comfort (Breast/Nipple): Filling, red/small blisters or bruises, mild/mod discomfort  Problem noted: Mild/Moderate discomfort (positional stripes bilateral) Interventions (Mild/moderate discomfort): Hand massage;Hand expression (EBM for nipple tenderness)  Hold (Positioning): Assistance needed to correctly position infant at breast and maintain latch. Intervention(s): Support Pillows;Position options;Breastfeeding basics reviewed  LATCH Score: 7  Lactation Tools Discussed/Used     Consult Status Consult Status: Complete Date: 09/30/15 Follow-up type: In-patient    Alfred LevinsGranger, Harman Ferrin Ann 09/30/2015, 9:57 AM

## 2016-03-25 ENCOUNTER — Ambulatory Visit: Payer: Managed Care, Other (non HMO) | Admitting: Infectious Disease

## 2016-05-13 ENCOUNTER — Ambulatory Visit (INDEPENDENT_AMBULATORY_CARE_PROVIDER_SITE_OTHER): Payer: Managed Care, Other (non HMO) | Admitting: Infectious Disease

## 2016-05-13 ENCOUNTER — Encounter: Payer: Self-pay | Admitting: Infectious Disease

## 2016-05-13 DIAGNOSIS — B181 Chronic viral hepatitis B without delta-agent: Secondary | ICD-10-CM | POA: Diagnosis not present

## 2016-05-13 NOTE — Progress Notes (Signed)
Chief complaint: followup for HBV infection in previously pregnant patient  Subjective:    Patient ID: Vicki Allen, female    DOB: 01/26/1981, 36 y.o.   MRN: [id number]  HPI  36 year old lady from [place] who was diagnosed with Hepatitis B while still in her native country.We had evaluated her when she was pregnant with her second child. She was hepatitis B B e ag ab +.  LFT's were normal, and hepatitis B DNA was low consistently.  He has no symptoms that were consistent with liver disease at present but we will recheck her liver function tests hep B DNA and rescreen her for HIV.  He will need screening for hepatocellular cancer given her chronic hepatitis B at least when she reaches the age of 36    Past Medical History:  Diagnosis Date  . Chronic hepatitis B without delta agent without cirrhosis (HCC) 06/03/2015  . High-risk pregnancy 06/03/2015  . Vaginal bleeding during pregnancy, antepartum 09/23/2015    No past surgical history on file. No surgery  Family History  Problem Relation Age of Onset  . Hypertension Mother       Social History   Social History  . Marital status: Married    Spouse name: N/A  . Number of children: N/A  . Years of education: N/A   Social History Main Topics  . Smoking status: Never Smoker  . Smokeless tobacco: Never Used  . Alcohol use No  . Drug use: No  . Sexual activity: Not Asked   Other Topics Concern  . None   Social History Narrative  . None    No Known Allergies  No current outpatient prescriptions on file.    Review of Systems  Constitutional: Negative for activity change, appetite change, chills, diaphoresis, fatigue, fever and unexpected weight change.  HENT: Negative for congestion, rhinorrhea, sinus pressure, sneezing, sore throat and trouble swallowing.   Eyes: Negative for photophobia and visual disturbance.  Respiratory: Negative for cough, chest tightness, shortness of breath, wheezing and stridor.     Cardiovascular: Negative for chest pain, palpitations and leg swelling.  Gastrointestinal: Negative for abdominal distention, abdominal pain, anal bleeding, blood in stool, constipation, diarrhea, nausea and vomiting.  Genitourinary: Negative for difficulty urinating, dysuria, flank pain and hematuria.  Musculoskeletal: Negative for arthralgias, back pain, gait problem, joint swelling and myalgias.  Skin: Negative for color change, pallor, rash and wound.  Neurological: Negative for dizziness, tremors, weakness and light-headedness.  Hematological: Negative for adenopathy. Does not bruise/bleed easily.  Psychiatric/Behavioral: Negative for agitation, behavioral problems, confusion, decreased concentration, dysphoric mood and sleep disturbance.       Objective:   Physical Exam  Constitutional: She is oriented to person, place, and time. She appears well-developed and well-nourished. No distress.  HENT:  Head: Normocephalic and atraumatic.  Mouth/Throat: No oropharyngeal exudate.  Eyes: Conjunctivae and EOM are normal. No scleral icterus.  Neck: Normal range of motion. Neck supple.  Cardiovascular: Normal rate and regular rhythm.   Pulmonary/Chest: Effort normal. No respiratory distress. She has no wheezes.  Abdominal: Soft. Bowel sounds are normal. There is no tenderness. There is no rebound.  Musculoskeletal: She exhibits no edema or tenderness.  Neurological: She is alert and oriented to person, place, and time. She exhibits normal muscle tone. Coordination normal.  Skin: Skin is warm and dry. No rash noted. She is not diaphoretic. No erythema. No pallor.  Psychiatric: She has a normal mood and affect. Her behavior is normal. Judgment and thought content  normal.  Nursing note and vitals reviewed.         Assessment & Plan:   Chronic hepatitis B without delta agent   --check Hepatitis B DNA  And recheck LFT's and HIV --rtc in 1 year

## 2016-05-14 LAB — COMPLETE METABOLIC PANEL WITH GFR
ALT: 13 U/L (ref 6–29)
AST: 17 U/L (ref 10–30)
Albumin: 4.5 g/dL (ref 3.6–5.1)
Alkaline Phosphatase: 65 U/L (ref 33–115)
BUN: 10 mg/dL (ref 7–25)
CHLORIDE: 108 mmol/L (ref 98–110)
CO2: 22 mmol/L (ref 20–31)
Calcium: 9.3 mg/dL (ref 8.6–10.2)
Creat: 0.66 mg/dL (ref 0.50–1.10)
Glucose, Bld: 90 mg/dL (ref 65–99)
Potassium: 4 mmol/L (ref 3.5–5.3)
Sodium: 142 mmol/L (ref 135–146)
Total Bilirubin: 0.4 mg/dL (ref 0.2–1.2)
Total Protein: 6.8 g/dL (ref 6.1–8.1)

## 2016-05-14 LAB — HIV ANTIBODY (ROUTINE TESTING W REFLEX): HIV: NONREACTIVE

## 2016-05-18 LAB — HEPATITIS B DNA, ULTRAQUANTITATIVE, PCR
HEPATITIS B DNA (CALC): 3.01 {Log_IU}/mL — AB (ref <1.30)
Hepatitis B DNA: 1013 IU/mL — ABNORMAL HIGH (ref <20)

## 2017-04-22 LAB — OB RESULTS CONSOLE RPR: RPR: NONREACTIVE

## 2017-04-22 LAB — OB RESULTS CONSOLE HEPATITIS B SURFACE ANTIGEN: HEP B S AG: POSITIVE

## 2017-04-22 LAB — OB RESULTS CONSOLE ABO/RH: RH TYPE: POSITIVE

## 2017-04-22 LAB — OB RESULTS CONSOLE ANTIBODY SCREEN: ANTIBODY SCREEN: NEGATIVE

## 2017-04-22 LAB — OB RESULTS CONSOLE GC/CHLAMYDIA
Chlamydia: NEGATIVE
GC PROBE AMP, GENITAL: NEGATIVE

## 2017-04-22 LAB — OB RESULTS CONSOLE RUBELLA ANTIBODY, IGM: RUBELLA: IMMUNE

## 2017-04-22 LAB — OB RESULTS CONSOLE HIV ANTIBODY (ROUTINE TESTING): HIV: NONREACTIVE

## 2017-05-11 NOTE — L&D Delivery Note (Signed)
Delivery Note Pt became complete at 1824. She had SROM for clear fluid when the bag was at the introitus, at 1831, followed at 6:34 PM by a viable female was delivered via Vaginal, Spontaneous (Presentation: LOA).  APGAR: 8, 9; weight: pending .  Tight nuchal x 1, reduced only as shoulders were delivering. Infant dried and placed on pt's abd; cord clamped and cut by CNM. Hospital cord blood sample collected. Placenta status: spont, intact .  Cord: 3 vessel  Anesthesia:  None   Episiotomy: None Lacerations: None Est. Blood Loss (mL): 250  Mom to postpartum.  Baby to Couplet care / Skin to Skin.  Amp just finished infusing as baby delivered, but membranes were only ruptured x 3 mins prior to birth.  Cam HaiSHAW, Corena Tilson CNM 10/15/2017, 7:12 PM

## 2017-09-17 LAB — OB RESULTS CONSOLE GC/CHLAMYDIA
Chlamydia: NEGATIVE
Gonorrhea: NEGATIVE

## 2017-09-17 LAB — OB RESULTS CONSOLE GBS: STREP GROUP B AG: POSITIVE

## 2017-09-27 ENCOUNTER — Other Ambulatory Visit: Payer: Self-pay | Admitting: Obstetrics & Gynecology

## 2017-10-15 ENCOUNTER — Inpatient Hospital Stay (HOSPITAL_COMMUNITY)
Admission: AD | Admit: 2017-10-15 | Discharge: 2017-10-17 | DRG: 806 | Disposition: A | Discharge status: Home / Self Care | Payer: Medicaid Other | Attending: Obstetrics & Gynecology | Admitting: Obstetrics & Gynecology

## 2017-10-15 ENCOUNTER — Encounter (HOSPITAL_COMMUNITY): Payer: Self-pay | Admitting: *Deleted

## 2017-10-15 ENCOUNTER — Other Ambulatory Visit: Payer: Self-pay

## 2017-10-15 DIAGNOSIS — B181 Chronic viral hepatitis B without delta-agent: Secondary | ICD-10-CM | POA: Diagnosis present

## 2017-10-15 DIAGNOSIS — Z3A40 40 weeks gestation of pregnancy: Secondary | ICD-10-CM

## 2017-10-15 DIAGNOSIS — O9842 Viral hepatitis complicating childbirth: Secondary | ICD-10-CM | POA: Diagnosis present

## 2017-10-15 DIAGNOSIS — Z3483 Encounter for supervision of other normal pregnancy, third trimester: Secondary | ICD-10-CM | POA: Diagnosis present

## 2017-10-15 DIAGNOSIS — O99824 Streptococcus B carrier state complicating childbirth: Secondary | ICD-10-CM

## 2017-10-15 LAB — CBC
HEMATOCRIT: 37.5 % (ref 36.0–46.0)
Hemoglobin: 13.4 g/dL (ref 12.0–15.0)
MCH: 34.7 pg — ABNORMAL HIGH (ref 26.0–34.0)
MCHC: 35.7 g/dL (ref 30.0–36.0)
MCV: 97.2 fL (ref 78.0–100.0)
PLATELETS: 254 10*3/uL (ref 150–400)
RBC: 3.86 MIL/uL — ABNORMAL LOW (ref 3.87–5.11)
RDW: 13.2 % (ref 11.5–15.5)
WBC: 7.6 10*3/uL (ref 4.0–10.5)

## 2017-10-15 MED ORDER — SOD CITRATE-CITRIC ACID 500-334 MG/5ML PO SOLN: 30.0000 mL | ORAL | Status: DC | PRN

## 2017-10-15 MED ORDER — ACETAMINOPHEN 325 MG PO TABS: 650.0000 mg | ORAL_TABLET | ORAL | Status: DC | PRN

## 2017-10-15 MED ORDER — WITCH HAZEL-GLYCERIN EX PADS: 1.0000 "application " | MEDICATED_PAD | CUTANEOUS | Status: DC | PRN

## 2017-10-15 MED ORDER — SENNOSIDES-DOCUSATE SODIUM 8.6-50 MG PO TABS
2.0000 | ORAL_TABLET | ORAL | Status: DC
  Administered 2017-10-15 – 2017-10-16 (×2): 2 via ORAL
  Filled 2017-10-15 (×2): qty 2

## 2017-10-15 MED ORDER — FENTANYL CITRATE (PF) 100 MCG/2ML IJ SOLN: 100.0000 ug | INTRAMUSCULAR | Status: DC | PRN

## 2017-10-15 MED ORDER — OXYCODONE-ACETAMINOPHEN 5-325 MG PO TABS: 2.0000 | ORAL_TABLET | ORAL | Status: DC | PRN

## 2017-10-15 MED ORDER — OXYTOCIN BOLUS FROM INFUSION
500.0000 mL | Freq: Once | INTRAVENOUS | Status: AC
  Administered 2017-10-15: 500 mL via INTRAVENOUS

## 2017-10-15 MED ORDER — LIDOCAINE HCL (PF) 1 % IJ SOLN
30.0000 mL | INTRAMUSCULAR | Status: DC | PRN
  Filled 2017-10-15: qty 30

## 2017-10-15 MED ORDER — SIMETHICONE 80 MG PO CHEW: 80.0000 mg | CHEWABLE_TABLET | ORAL | Status: DC | PRN

## 2017-10-15 MED ORDER — ONDANSETRON HCL 4 MG/2ML IJ SOLN: 4.0000 mg | Freq: Four times a day (QID) | INTRAMUSCULAR | Status: DC | PRN

## 2017-10-15 MED ORDER — COCONUT OIL OIL: 1.0000 "application " | TOPICAL_OIL | Status: DC | PRN

## 2017-10-15 MED ORDER — DIBUCAINE 1 % RE OINT: 1.0000 "application " | TOPICAL_OINTMENT | RECTAL | Status: DC | PRN

## 2017-10-15 MED ORDER — IBUPROFEN 600 MG PO TABS
600.0000 mg | ORAL_TABLET | Freq: Four times a day (QID) | ORAL | Status: DC
  Administered 2017-10-15 – 2017-10-17 (×8): 600 mg via ORAL
  Filled 2017-10-15 (×8): qty 1

## 2017-10-15 MED ORDER — LACTATED RINGERS IV SOLN
INTRAVENOUS | Status: DC
  Administered 2017-10-15: 19:00:00 via INTRAVENOUS

## 2017-10-15 MED ORDER — BENZOCAINE-MENTHOL 20-0.5 % EX AERO: 1.0000 "application " | INHALATION_SPRAY | CUTANEOUS | Status: DC | PRN

## 2017-10-15 MED ORDER — ONDANSETRON HCL 4 MG PO TABS: 4.0000 mg | ORAL_TABLET | ORAL | Status: DC | PRN

## 2017-10-15 MED ORDER — TETANUS-DIPHTH-ACELL PERTUSSIS 5-2.5-18.5 LF-MCG/0.5 IM SUSP: 0.5000 mL | Freq: Once | INTRAMUSCULAR | Status: DC

## 2017-10-15 MED ORDER — OXYCODONE-ACETAMINOPHEN 5-325 MG PO TABS: 1.0000 | ORAL_TABLET | ORAL | Status: DC | PRN

## 2017-10-15 MED ORDER — DIPHENHYDRAMINE HCL 25 MG PO CAPS: 25.0000 mg | ORAL_CAPSULE | Freq: Four times a day (QID) | ORAL | Status: DC | PRN

## 2017-10-15 MED ORDER — ZOLPIDEM TARTRATE 5 MG PO TABS: 5.0000 mg | ORAL_TABLET | Freq: Every evening | ORAL | Status: DC | PRN

## 2017-10-15 MED ORDER — OXYTOCIN 40 UNITS IN LACTATED RINGERS INFUSION - SIMPLE MED
2.5000 [IU]/h | INTRAVENOUS | Status: DC
  Filled 2017-10-15: qty 1000

## 2017-10-15 MED ORDER — LACTATED RINGERS IV SOLN: 500.0000 mL | INTRAVENOUS | Status: DC | PRN

## 2017-10-15 MED ORDER — SODIUM CHLORIDE 0.9 % IV SOLN
2.0000 g | Freq: Once | INTRAVENOUS | Status: AC
  Administered 2017-10-15: 2 g via INTRAVENOUS
  Filled 2017-10-15: qty 2

## 2017-10-15 MED ORDER — ONDANSETRON HCL 4 MG/2ML IJ SOLN: 4.0000 mg | INTRAMUSCULAR | Status: DC | PRN

## 2017-10-15 MED ORDER — OXYCODONE HCL 5 MG PO TABS: 5.0000 mg | ORAL_TABLET | ORAL | Status: DC | PRN

## 2017-10-15 MED ORDER — PRENATAL MULTIVITAMIN CH
1.0000 | ORAL_TABLET | Freq: Every day | ORAL | Status: DC
  Administered 2017-10-16 – 2017-10-17 (×2): 1 via ORAL
  Filled 2017-10-15 (×2): qty 1

## 2017-10-15 NOTE — H&P (Signed)
Vicki Allen is a 37 y.o. female G2P1001 @ 40.0wks by LMP and confirmed with 19wk U/S presenting for reg ctx. [facility] and has been remarkable for 1) chronic Hep B pos 2) GBS pos  OB History    Gravida  2   Para  2   Term  2   Preterm      AB      Living  1     SAB      TAB      Ectopic      Multiple  0   Live Births  1          Past Medical History:  Diagnosis Date  . Chronic hepatitis B without delta agent without cirrhosis (HCC) 06/03/2015  . High-risk pregnancy 06/03/2015  . Vaginal bleeding during pregnancy, antepartum 09/23/2015   No past surgical history on file. Family History: family history includes Hypertension in her mother. Social History:  reports that she has never smoked. She has never used smokeless tobacco. She reports that she does not drink alcohol or use drugs.     Maternal Diabetes: No Genetic Screening: Normal Maternal Ultrasounds/Referrals: Normal Fetal Ultrasounds or other Referrals:  None Maternal Substance Abuse:  No Significant Maternal Medications:  None Significant Maternal Lab Results:  Lab values include: Group B Strep positive, HBsAG positive Other Comments:  None  ROS History Dilation: 7 Effacement (%): 100 Station: -1 Exam by:: Weston,RN  Blood pressure 122/77, pulse 80, temperature 98 F (36.7 C), temperature source Oral, resp. rate 20, currently breastfeeding. Exam Physical Exam  Constitutional: She is oriented to person, place, and time. She appears well-developed.  HENT:  Head: Normocephalic.  Neck: Normal range of motion.  Cardiovascular: Normal rate.  Respiratory: Effort normal.  GI:  FHR 120s, +accels, no decels Ctx q 3 mins  Musculoskeletal: Normal range of motion.  Neurological: She is alert and oriented to person, place, and time.  Skin: Skin is warm and dry.  Psychiatric: She has a normal mood and affect. Her behavior is normal. Thought content normal.    Prenatal  labs: (04/22/17) ABO, Rh:  AB pos Antibody:  neg Rubella:  immune RPR:   NR HBsAg:   POSITIVE HIV:   NR GBS:   pos (09/17/17)  Assessment/Plan: IUP@term  Active labor GBS pos  [facility] Amp for GBS ppx Anticipate SVD Plan on appropriate measures for infant with mom's Hep B carrier status   Kimari Coudriet CNM 10/15/2017, 6:10 PM

## 2017-10-16 LAB — RPR: RPR Ser Ql: NONREACTIVE

## 2017-10-16 LAB — TYPE AND SCREEN
ABO/RH(D): AB POS
ANTIBODY SCREEN: NEGATIVE

## 2017-10-16 NOTE — Progress Notes (Signed)
POSTPARTUM PROGRESS NOTE  Post Partum Day 1 Subjective:  Vicki Allen is a 37 y.o. G3P2001 3294w1d s/p SVD.  No acute events overnight.  Pt denies problems with ambulating, voiding or po intake.  She denies nausea or vomiting.  Pain is well controlled.  She has had flatus. She has had bowel movement.  Lochia Minimal. Patient denies CP, HA, dizziness, SOB.  Objective: Blood pressure 98/71, pulse 84, temperature 98.9 F (37.2 C), temperature source Oral, resp. rate 16, height 5\' 5"  (1.651 m), weight 147 lb (66.7 kg), currently breastfeeding.  Physical Exam:  General: alert, cooperative and no distress Lochia:normal flow Chest: CTAB Heart: RRR no m/r/g Abdomen: +BS, soft, nontender,  Uterine Fundus: firm, nontender DVT Evaluation: No calf swelling or tenderness Extremities: No evidence of LE edema  Recent Labs    10/15/17 1821  HGB 13.4  HCT 37.5    Assessment/Plan:  ASSESSMENT: Vicki Allen is a 37 y.o. G3P2001 7994w1d s/p SVD.  Plan for discharge tomorrow Breastfeeding without issue Contraception: IUD Outpatient   LOS: 1 day   Cyndee Brightlyonnor  M Anaih Brander, Medical Student 10/16/2017, 8:47 AM

## 2017-10-16 NOTE — Lactation Note (Signed)
This note was copied from a baby's chart. Lactation Consultation Note  Patient Name: Vicki Allen Today's Date: 10/16/2017 Reason for consult: Initial assessment;Term  P2 mother whose infant is now 2111 hours old.  Baby swaddled and sleeping in bassinet as I arrived.  Mother has no questions/concerns about breastfeeding at this time.  Her breasts are soft and non tender and she has no pain with latching.  I encouraged her to have her RN observe the next latch.    Mother will feed 8-12 times/ 24 hours or earlier if he shows feeding cues.  I encouraged STS, breast massage and hand expression.  Mother verbalized understanding and is familiar with hand expression.  Mom made aware of O/P services, breastfeeding support groups, community resources, and our phone # for post-discharge questions.   No support person present as of now.   Maternal Data Formula Feeding for Exclusion: No Does the patient have breastfeeding experience prior to this delivery?: Yes  Feeding Feeding Type: Breast Fed Length of feed: 30 min  LATCH Score                   Interventions    Lactation Tools Discussed/Used     Consult Status Consult Status: Follow-up Date: 10/16/17 Follow-up type: In-patient    Kayli Beal R Delania Ferg 10/16/2017, 5:44 AM

## 2017-10-17 ENCOUNTER — Other Ambulatory Visit: Payer: Self-pay | Admitting: Obstetrics & Gynecology

## 2017-10-17 MED ORDER — IBUPROFEN 600 MG PO TABS: 600.0000 mg | ORAL_TABLET | Freq: Four times a day (QID) | ORAL | 0 refills | Status: DC

## 2017-10-17 NOTE — Discharge Summary (Signed)
OB Discharge Summary     Patient Name: Vicki Allen DOB: 22-Jul-1980 MRN: [id number]  Date of admission: 10/15/2017 Delivering MD: Cam Hai D   Date of discharge: 10/17/2017  Admitting diagnosis: 40 WKS CTX Intrauterine pregnancy: [redacted]w[redacted]d     Secondary diagnosis:  Active Problems:   Labor and delivery indication for care or intervention  Additional problems: Chronic Hep B-Followed by ID OP     Discharge diagnosis: Term Pregnancy Delivered                                                                                                Post partum procedures:None  Augmentation: None  Complications: None  Hospital course:  Onset of Labor With Vaginal Delivery     37 y.o. yo G3P2001 at [redacted]w[redacted]d was admitted in Active Labor on 10/15/2017. Patient had an uncomplicated labor course as follows:  Membrane Rupture Time/Date: 6:31 PM ,10/15/2017   Intrapartum Procedures: Episiotomy: None [1]                                         Lacerations:  None [1]  Patient had a delivery of a Viable infant. 10/15/2017  Information for the patient's newborn:  Zada Girt [[id number]]  Delivery Method: Vag-Spont    Pateint had an uncomplicated postpartum course.  She is ambulating, tolerating a regular diet, passing flatus, and urinating well. Patient is discharged home in stable condition on 10/19/17.   Physical exam  Vitals:   10/16/17 0238 10/16/17 1023 10/16/17 1627 10/17/17 0541  BP: 98/71 105/79 103/77 110/85  Pulse: 84 78 79 71  Resp: 16 18 18 18   Temp: 98.9 F (37.2 C) 99.9 F (37.7 C) 98.1 F (36.7 C) 97.8 F (36.6 C)  TempSrc: Oral Oral Oral Oral  Weight:      Height:       General: alert, cooperative and no distress Lochia: appropriate Uterine Fundus: firm Incision: N/A DVT Evaluation: No evidence of DVT seen on physical exam. Labs: Lab Results  Component Value Date   WBC 7.6 10/15/2017   HGB 13.4 10/15/2017   HCT 37.5 10/15/2017   MCV 97.2 10/15/2017   PLT 254  10/15/2017   CMP Latest Ref Rng & Units 05/13/2016  Glucose 65 - 99 mg/dL 90  BUN 7 - 25 mg/dL 10  Creatinine 0.86 - 5.78 mg/dL 4.69  Sodium 629 - 528 mmol/L 142  Potassium 3.5 - 5.3 mmol/L 4.0  Chloride 98 - 110 mmol/L 108  CO2 20 - 31 mmol/L 22  Calcium 8.6 - 10.2 mg/dL 9.3  Total Protein 6.1 - 8.1 g/dL 6.8  Total Bilirubin 0.2 - 1.2 mg/dL 0.4  Alkaline Phos 33 - 115 U/L 65  AST 10 - 30 U/L 17  ALT 6 - 29 U/L 13    Discharge instruction: per After Visit Summary and "Baby and Me Booklet".  After visit meds:  Allergies as of 10/17/2017   No Known Allergies     Medication List  TAKE these medications   ibuprofen 600 MG tablet Commonly known as:  ADVIL,MOTRIN Take 1 tablet (600 mg total) by mouth every 6 (six) hours.   PRENATAL VITAMIN PLUS LOW IRON 27-1 MG Tabs TAKE 1 TABLET BY MOUTH EVERY DAY       Diet: routine diet  Activity: Advance as tolerated. Pelvic rest for 6 weeks.   Outpatient follow up:4 weeks Follow up Appt:No future appointments. Follow up Visit:No follow-ups on file.  Postpartum contraception: IUD Mirena  Newborn Data: Live born female  Birth Weight: 8 lb 4.8 oz (3765 g) APGAR: 8, 9  Newborn Delivery   Birth date/time:  10/15/2017 18:34:00 Delivery type:  Vaginal, Spontaneous     Baby Feeding: Breast Disposition:home with mother  F/U w/ ID for Hep B as scheduled.    10/17/2017 Dorathy KinsmanVirginia Regnald Bowens, CNM

## 2017-10-17 NOTE — Lactation Note (Addendum)
This note was copied from a baby's chart. Lactation Consultation Note  Patient Name: Vicki Allen Today's Date: 10/17/2017    P3, Baby 38 hours old.  Ex BF for 2 years with other children. Unwrapped baby to wake for feeding. Mother hand expressed good flow of colostrum prior to latching. Baby latched in side lying on R side.  Sucks and swallows observed. Mom encouraged to feed baby 8-12 times/24 hours and with feeding cues.  Reviewed engorgement care and monitoring voids/stools. Provided mother w/ manual pump and demonstrated use. Returned to room to discuss breastfeeding and Hep B+ but visitors in room. Suggested to RN Kristine LineaKaren Kane when she does discharge teaching that if mother's nipples become cracked or bleeding,  mother should pump until healed due to risk of transmission.      Maternal Data    Feeding    LATCH Score                   Interventions    Lactation Tools Discussed/Used     Consult Status      Vicki Allen, Vicki Allen 10/17/2017, 9:16 AM

## 2017-10-17 NOTE — Discharge Instructions (Signed)
Postpartum Care After Vaginal Delivery °The period of time right after you deliver your newborn is called the postpartum period. °What kind of medical care will I receive? °· You may continue to receive fluids and medicines through an IV tube inserted into one of your veins. °· If an incision was made near your vagina (episiotomy) or if you had some vaginal tearing during delivery, cold compresses may be placed on your episiotomy or your tear. This helps to reduce pain and swelling. °· You may be given a squirt bottle to use when you go to the bathroom. You may use this until you are comfortable wiping as usual. To use the squirt bottle, follow these steps: °? Before you urinate, fill the squirt bottle with warm water. Do not use hot water. °? After you urinate, while you are sitting on the toilet, use the squirt bottle to rinse the area around your urethra and vaginal opening. This rinses away any urine and blood. °? You may do this instead of wiping. As you start healing, you may use the squirt bottle before wiping yourself. Make sure to wipe gently. °? Fill the squirt bottle with clean water every time you use the bathroom. °· You will be given sanitary pads to wear. °How can I expect to feel? °· You may not feel the need to urinate for several hours after delivery. °· You will have some soreness and pain in your abdomen and vagina. °· If you are breastfeeding, you may have uterine contractions every time you breastfeed for up to several weeks postpartum. Uterine contractions help your uterus return to its normal size. °· It is normal to have vaginal bleeding (lochia) after delivery. The amount and appearance of lochia is often similar to a menstrual period in the first week after delivery. It will gradually decrease over the next few weeks to a dry, yellow-brown discharge. For most women, lochia stops completely by 6-8 weeks after delivery. Vaginal bleeding can vary from woman to woman. °· Within the first few  days after delivery, you may have breast engorgement. This is when your breasts feel heavy, full, and uncomfortable. Your breasts may also throb and feel hard, tightly stretched, warm, and tender. After this occurs, you may have milk leaking from your breasts. Your health care provider can help you relieve discomfort due to breast engorgement. Breast engorgement should go away within a few days. °· You may feel more sad or worried than normal due to hormonal changes after delivery. These feelings should not last more than a few days. If these feelings do not go away after several days, speak with your health care provider. °How should I care for myself? °· Tell your health care provider if you have pain or discomfort. °· Drink enough water to keep your urine clear or pale yellow. °· Wash your hands thoroughly with soap and water for at least 20 seconds after changing your sanitary pads, after using the toilet, and before holding or feeding your baby. °· If you are not breastfeeding, avoid touching your breasts a lot. Doing this can make your breasts produce more milk. °· If you become weak or lightheaded, or you feel like you might faint, ask for help before: °? Getting out of bed. °? Showering. °· Change your sanitary pads frequently. Watch for any changes in your flow, such as a sudden increase in volume, a change in color, the passing of large blood clots. If you pass a blood clot from your vagina, save it   to show to your health care provider. Do not flush blood clots down the toilet without having your health care provider look at them. °· Make sure that all your vaccinations are up to date. This can help protect you and your baby from getting certain diseases. You may need to have immunizations done before you leave the hospital. °· If desired, talk with your health care provider about methods of family planning or birth control (contraception). °How can I start bonding with my baby? °Spending as much time as  possible with your baby is very important. During this time, you and your baby can get to know each other and develop a bond. Having your baby stay with you in your room (rooming in) can give you time to get to know your baby. Rooming in can also help you become comfortable caring for your baby. Breastfeeding can also help you bond with your baby. °How can I plan for returning home with my baby? °· Make sure that you have a car seat installed in your vehicle. °? Your car seat should be checked by a certified car seat installer to make sure that it is installed safely. °? Make sure that your baby fits into the car seat safely. °· Ask your health care provider any questions you have about caring for yourself or your baby. Make sure that you are able to contact your health care provider with any questions after leaving the hospital. °This information is not intended to replace advice given to you by your health care provider. Make sure you discuss any questions you have with your health care provider. °Document Released: 02/22/2007 Document Revised: 09/30/2015 Document Reviewed: 04/01/2015 °Elsevier Interactive Patient Education © 2018 [employer] ° °

## 2017-10-23 ENCOUNTER — Inpatient Hospital Stay (HOSPITAL_COMMUNITY): Payer: Managed Care, Other (non HMO)

## 2017-12-16 ENCOUNTER — Other Ambulatory Visit: Payer: Self-pay | Admitting: Obstetrics & Gynecology

## 2018-01-07 ENCOUNTER — Encounter (HOSPITAL_COMMUNITY): Payer: Self-pay

## 2022-06-17 ENCOUNTER — Telehealth: Payer: Self-pay

## 2022-06-17 NOTE — Telephone Encounter (Signed)
Mychart msg sent. AS, CMA 

## 2022-07-17 DIAGNOSIS — F410 Panic disorder [episodic paroxysmal anxiety] without agoraphobia: Secondary | ICD-10-CM | POA: Diagnosis not present

## 2022-08-04 ENCOUNTER — Other Ambulatory Visit: Payer: Self-pay | Admitting: Family Medicine

## 2022-08-04 DIAGNOSIS — Z1231 Encounter for screening mammogram for malignant neoplasm of breast: Secondary | ICD-10-CM

## 2022-08-24 DIAGNOSIS — Z1331 Encounter for screening for depression: Secondary | ICD-10-CM | POA: Diagnosis not present

## 2022-08-24 DIAGNOSIS — E7800 Pure hypercholesterolemia, unspecified: Secondary | ICD-10-CM | POA: Diagnosis not present

## 2022-08-24 DIAGNOSIS — Z0000 Encounter for general adult medical examination without abnormal findings: Secondary | ICD-10-CM | POA: Diagnosis not present

## 2022-08-24 DIAGNOSIS — R7301 Impaired fasting glucose: Secondary | ICD-10-CM | POA: Diagnosis not present

## 2022-08-27 ENCOUNTER — Other Ambulatory Visit: Payer: Self-pay | Admitting: Internal Medicine

## 2022-08-27 DIAGNOSIS — Z1231 Encounter for screening mammogram for malignant neoplasm of breast: Secondary | ICD-10-CM

## 2022-09-11 ENCOUNTER — Ambulatory Visit
Admission: RE | Admit: 2022-09-11 | Discharge: 2022-09-11 | Disposition: A | Discharge status: Home / Self Care | Payer: No Typology Code available for payment source | Source: Ambulatory Visit | Attending: Internal Medicine | Admitting: Internal Medicine

## 2022-09-11 DIAGNOSIS — Z1231 Encounter for screening mammogram for malignant neoplasm of breast: Secondary | ICD-10-CM

## 2022-09-17 ENCOUNTER — Ambulatory Visit: Payer: No Typology Code available for payment source

## 2022-10-08 DIAGNOSIS — Z30431 Encounter for routine checking of intrauterine contraceptive device: Secondary | ICD-10-CM | POA: Diagnosis not present

## 2022-10-08 DIAGNOSIS — Z114 Encounter for screening for human immunodeficiency virus [HIV]: Secondary | ICD-10-CM | POA: Diagnosis not present

## 2022-10-08 DIAGNOSIS — Z01419 Encounter for gynecological examination (general) (routine) without abnormal findings: Secondary | ICD-10-CM | POA: Diagnosis not present

## 2022-10-08 DIAGNOSIS — Z113 Encounter for screening for infections with a predominantly sexual mode of transmission: Secondary | ICD-10-CM | POA: Diagnosis not present

## 2022-10-08 DIAGNOSIS — Z3009 Encounter for other general counseling and advice on contraception: Secondary | ICD-10-CM | POA: Diagnosis not present

## 2023-08-09 ENCOUNTER — Ambulatory Visit (INDEPENDENT_AMBULATORY_CARE_PROVIDER_SITE_OTHER): Payer: Self-pay | Admitting: Adult Health

## 2023-08-09 VITALS — BP 128/88 | HR 94 | Temp 97.8°F | Resp 18 | Ht 65.0 in | Wt 117.4 lb

## 2023-08-09 DIAGNOSIS — B181 Chronic viral hepatitis B without delta-agent: Secondary | ICD-10-CM | POA: Diagnosis not present

## 2023-08-09 DIAGNOSIS — Z131 Encounter for screening for diabetes mellitus: Secondary | ICD-10-CM

## 2023-08-09 DIAGNOSIS — Z7689 Persons encountering health services in other specified circumstances: Secondary | ICD-10-CM | POA: Diagnosis not present

## 2023-08-09 DIAGNOSIS — Z113 Encounter for screening for infections with a predominantly sexual mode of transmission: Secondary | ICD-10-CM

## 2023-08-09 DIAGNOSIS — Z124 Encounter for screening for malignant neoplasm of cervix: Secondary | ICD-10-CM

## 2023-08-09 DIAGNOSIS — F419 Anxiety disorder, unspecified: Secondary | ICD-10-CM | POA: Diagnosis not present

## 2023-08-09 DIAGNOSIS — Z13220 Encounter for screening for lipoid disorders: Secondary | ICD-10-CM | POA: Diagnosis not present

## 2023-08-09 NOTE — Progress Notes (Signed)
 [facility] clinic  Provider:  Kenard Gower DNP  Code Status:  Full Code  Goals of Care:     08/09/2023    3:09 PM  Advanced Directives  Does Patient Have a Medical Advance Directive? No  Would patient like information on creating a medical advance directive? No - Patient declined     Chief Complaint  Patient presents with   Establish Care    NEW Patient    Discussed the use of AI scribe software for clinical note transcription with the patient, who gave verbal consent to proceed.  HPI: Patient is a 43 y.o. female seen today to establish care with [facility].  She experiences significant anxiety while driving, which began approximately three years ago. Initially, she felt scared and reduced speed while driving, but the anxiety has since worsened, preventing her from driving to school or work. She describes feeling 'really scared' and notes that her driving speed is significantly reduced, often to 25-30 mph. She has not been diagnosed with anxiety but suspects it may be related to past family trauma involving car accidents. She has not experienced any accidents herself. No shortness of breath, fractures, or anemia.  She has a history of hepatitis B, first identified during her first pregnancy. Her hepatitis B surface antigen is positive, but her liver enzymes were normal as of 2017. She has not received the hepatitis B vaccine due to her positive status. She reports regular bowel movements and light, irregular menstrual periods due to an IUD, which has been in place for five years.  Socially, she is married with three children aged ten, seven, and five. 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 She reports no smoking and occasional alcohol consumption, typically beer, about once a month. She does not drink coffee and primarily consumes water. She is originally from [place].  Her family history is significant for her father passing away at 30 from leukemia and her mother at  44 from complications related to schizophrenia and subsequent strokes. She has five sisters and one brother, all reportedly healthy, residing in [place], [place].    Past Medical History:  Diagnosis Date   Anxiety    Chronic hepatitis B without delta agent without cirrhosis (HCC) 06/03/2015   High-risk pregnancy 06/03/2015   Vaginal bleeding during pregnancy, antepartum 09/23/2015    History reviewed. No pertinent surgical history.  No Known Allergies  Outpatient Encounter Medications as of 08/09/2023  Medication Sig   ibuprofen (ADVIL,MOTRIN) 600 MG tablet Take 1 tablet (600 mg total) by mouth every 6 (six) hours. (Patient not taking: Reported on 08/09/2023)   Prenatal Vit-Fe Fumarate-FA (PRENATAL VITAMIN PLUS LOW IRON) 27-1 MG TABS TAKE 1 TABLET BY MOUTH EVERY DAY (Patient not taking: Reported on 08/09/2023)   No facility-administered encounter medications on file as of 08/09/2023.    Review of Systems:  Review of Systems  Constitutional:  Negative for appetite change, chills, fatigue and fever.  HENT:  Negative for congestion, hearing loss, rhinorrhea and sore throat.   Eyes: Negative.   Respiratory:  Negative for cough, shortness of breath and wheezing.   Cardiovascular:  Negative for chest pain, palpitations and leg swelling.  Gastrointestinal:  Negative for abdominal pain, constipation, diarrhea, nausea and vomiting.  Genitourinary:  Negative for dysuria.  Musculoskeletal:  Negative for arthralgias, back pain and myalgias.  Skin:  Negative for color change, rash and wound.  Neurological:  Negative for dizziness, weakness and headaches.  Psychiatric/Behavioral:  Negative for  behavioral problems. The patient is nervous/anxious.     Health Maintenance  Topic Date Due   Pneumococcal Vaccine 31-86 Years old (1 of 2 - PCV) Never done   Hepatitis C Screening  Never done   DTaP/Tdap/Td (1 - Tdap) Never done   Cervical Cancer Screening (HPV/Pap Cotest)  Never done   INFLUENZA  VACCINE  12/10/2022   COVID-19 Vaccine (1 - 2024-25 season) Never done   HIV Screening  Completed   HPV VACCINES  Aged Out    Physical Exam: Vitals:   08/09/23 1512  BP: 128/88  Pulse: 94  Resp: 18  Temp: 97.8 F (36.6 C)  SpO2: 97%  Weight: 117 lb 6.4 oz (53.3 kg)  Height: 5\' 5"  (1.651 m)   Body mass index is 19.54 kg/m. Physical Exam Constitutional:      General: She is not in acute distress.    Appearance: Normal appearance.  HENT:     Head: Normocephalic and atraumatic.     Nose: Nose normal.     Mouth/Throat:     Mouth: Mucous membranes are moist.  Eyes:     Conjunctiva/sclera: Conjunctivae normal.  Cardiovascular:     Rate and Rhythm: Normal rate and regular rhythm.  Pulmonary:     Effort: Pulmonary effort is normal.     Breath sounds: Normal breath sounds.  Abdominal:     General: Bowel sounds are normal.     Palpations: Abdomen is soft.  Musculoskeletal:        General: Normal range of motion.     Cervical back: Normal range of motion.  Skin:    General: Skin is warm and dry.  Neurological:     General: No focal deficit present.     Mental Status: She is alert and oriented to person, place, and time.  Psychiatric:        Mood and Affect: Mood normal.        Behavior: Behavior normal.        Thought Content: Thought content normal.        Judgment: Judgment normal.     Labs reviewed: Basic Metabolic Panel: No results for input(s): "NA", "K", "CL", "CO2", "GLUCOSE", "BUN", "CREATININE", "CALCIUM", "MG", "PHOS", "TSH" in the last 8760 hours. Liver Function Tests: No results for input(s): "AST", "ALT", "ALKPHOS", "BILITOT", "PROT", "ALBUMIN" in the last 8760 hours. No results for input(s): "LIPASE", "AMYLASE" in the last 8760 hours. No results for input(s): "AMMONIA" in the last 8760 hours. CBC: No results for input(s): "WBC", "NEUTROABS", "HGB", "HCT", "MCV", "PLT" in the last 8760 hours. Lipid Panel: No results for input(s): "CHOL", "HDL",  "LDLCALC", "TRIG", "CHOLHDL", "LDLDIRECT" in the last 8760 hours. No results found for: "HGBA1C"  Procedures since last visit: No results found.  Assessment/Plan  1. Encounter to establish care (Primary) -  established care with [facility]  2. Anxiety -  Experiences anxiety specifically related to driving, characterized by fear and reduced speed. This has been intermittent over the past few years, with periods of normal driving. She prefers not to take medication due to potential side effects such as drowsiness. - Encourage practice driving to build confidence and reduce anxiety - Advise against medication for anxiety due to potential drowsiness and the need for her to drive independently  3. Chronic hepatitis B without delta agent without cirrhosis (HCC) -  Chronic Hepatitis B infection with positive surface antigen. Liver enzymes were normal in 2017, indicating no active liver damage at that time. She has tested positive  for Hepatitis B since her first pregnancy. No current symptoms or complications reported. - Order CBC, CMP, and liver enzyme tests to assess current liver function - Screen for Hepatitis C  4. Screen for STD (sexually transmitted disease) - Hep C Antibody  5. Screening for diabetes mellitus (DM) - CBC with Differential/Platelets - Complete Metabolic Panel with eGFR - Hemoglobin A1C  6. Screening for hyperlipidemia - Lipid panel  7. Screening for cervical cancer -  has IUD in place - Ambulatory referral to Obstetrics / Gynecology    General Health Maintenance Up to date with tetanus vaccination and had a mammogram in May 2024. - Advise annual mammogram, next due in May 2025 - Encourage healthy diet and regular exercise   Labs/tests ordered:   CBC, CMP, lipid panel, A1C, hep C antibody   Return in about 3 weeks (around 08/30/2023).  Kenard Gower, NP

## 2023-08-09 NOTE — Patient Instructions (Signed)
 Preventive Care 16-44 Years Old, Female  Preventive care refers to lifestyle choices and visits with your health care provider that can promote health and wellness. Preventive care visits are also called wellness exams.  What can I expect for my preventive care visit?  Counseling  Your health care provider may ask you questions about your:  Medical history, including:  Past medical problems.  Family medical history.  Pregnancy history.  Current health, including:  Menstrual cycle.  Method of birth control.  Emotional well-being.  Home life and relationship well-being.  Sexual activity and sexual health.  Lifestyle, including:  Alcohol, nicotine or tobacco, and drug use.  Access to firearms.  Diet, exercise, and sleep habits.  Work and work Astronomer.  Sunscreen use.  Safety issues such as seatbelt and bike helmet use.  Physical exam  Your health care provider will check your:  Height and weight. These may be used to calculate your BMI (body mass index). BMI is a measurement that tells if you are at a healthy weight.  Waist circumference. This measures the distance around your waistline. This measurement also tells if you are at a healthy weight and may help predict your risk of certain diseases, such as type 2 diabetes and high blood pressure.  Heart rate and blood pressure.  Body temperature.  Skin for abnormal spots.  What immunizations do I need?    Vaccines are usually given at various ages, according to a schedule. Your health care provider will recommend vaccines for you based on your age, medical history, and lifestyle or other factors, such as travel or where you work.  What tests do I need?  Screening  Your health care provider may recommend screening tests for certain conditions. This may include:  Lipid and cholesterol levels.  Diabetes screening. This is done by checking your blood sugar (glucose) after you have not eaten for a while (fasting).  Pelvic exam and Pap test.  Hepatitis B test.  Hepatitis C  test.  HIV (human immunodeficiency virus) test.  STI (sexually transmitted infection) testing, if you are at risk.  Lung cancer screening.  Colorectal cancer screening.  Mammogram. Talk with your health care provider about when you should start having regular mammograms. This may depend on whether you have a family history of breast cancer.  BRCA-related cancer screening. This may be done if you have a family history of breast, ovarian, tubal, or peritoneal cancers.  Bone density scan. This is done to screen for osteoporosis.  Talk with your health care provider about your test results, treatment options, and if necessary, the need for more tests.  Follow these instructions at home:  Eating and drinking    Eat a diet that includes fresh fruits and vegetables, whole grains, lean protein, and low-fat dairy products.  Take vitamin and mineral supplements as recommended by your health care provider.  Do not drink alcohol if:  Your health care provider tells you not to drink.  You are pregnant, may be pregnant, or are planning to become pregnant.  If you drink alcohol:  Limit how much you have to 0-1 drink a day.  Know how much alcohol is in your drink. In the [place] one drink equals one 12 oz bottle of beer (355 mL), one 5 oz glass of wine (148 mL), or one 1 oz glass of hard liquor (44 mL).  Lifestyle  Brush your teeth every morning and night with fluoride toothpaste. Floss one time each day.  Exercise for at least  30 minutes 5 or more days each week.  Do not use any products that contain nicotine or tobacco. These products include cigarettes, chewing tobacco, and vaping devices, such as e-cigarettes. If you need help quitting, ask your health care provider.  Do not use drugs.  If you are sexually active, practice safe sex. Use a condom or other form of protection to prevent STIs.  If you do not wish to become pregnant, use a form of birth control. If you plan to become pregnant, see your health care provider for a  prepregnancy visit.  Take aspirin only as told by your health care provider. Make sure that you understand how much to take and what form to take. Work with your health care provider to find out whether it is safe and beneficial for you to take aspirin daily.  Find healthy ways to manage stress, such as:  Meditation, yoga, or listening to music.  Journaling.  Talking to a trusted person.  Spending time with friends and family.  Minimize exposure to UV radiation to reduce your risk of skin cancer.  Safety  Always wear your seat belt while driving or riding in a vehicle.  Do not drive:  If you have been drinking alcohol. Do not ride with someone who has been drinking.  When you are tired or distracted.  While texting.  If you have been using any mind-altering substances or drugs.  Wear a helmet and other protective equipment during sports activities.  If you have firearms in your house, make sure you follow all gun safety procedures.  Seek help if you have been physically or sexually abused.  What's next?  Visit your health care provider once a year for an annual wellness visit.  Ask your health care provider how often you should have your eyes and teeth checked.  Stay up to date on all vaccines.  This information is not intended to replace advice given to you by your health care provider. Make sure you discuss any questions you have with your health care provider.  Document Revised: 10/23/2020 Document Reviewed: 10/23/2020  Elsevier Patient Education  2024 [employer].

## 2023-08-10 LAB — COMPLETE METABOLIC PANEL WITHOUT GFR
AG Ratio: 2.2 (calc) (ref 1.0–2.5)
ALT: 47 U/L — ABNORMAL HIGH (ref 6–29)
AST: 122 U/L — ABNORMAL HIGH (ref 10–30)
Albumin: 4.8 g/dL (ref 3.6–5.1)
Alkaline phosphatase (APISO): 50 U/L (ref 31–125)
BUN: 10 mg/dL (ref 7–25)
CO2: 26 mmol/L (ref 20–32)
Calcium: 9.6 mg/dL (ref 8.6–10.2)
Chloride: 105 mmol/L (ref 98–110)
Creat: 0.6 mg/dL (ref 0.50–0.99)
Globulin: 2.2 g/dL (ref 1.9–3.7)
Glucose, Bld: 102 mg/dL (ref 65–139)
Potassium: 4.6 mmol/L (ref 3.5–5.3)
Sodium: 138 mmol/L (ref 135–146)
Total Bilirubin: 0.4 mg/dL (ref 0.2–1.2)
Total Protein: 7 g/dL (ref 6.1–8.1)

## 2023-08-10 LAB — CBC WITH DIFFERENTIAL/PLATELET
Absolute Lymphocytes: 1578 {cells}/uL (ref 850–3900)
Absolute Monocytes: 336 {cells}/uL (ref 200–950)
Basophils Absolute: 28 {cells}/uL (ref 0–200)
Basophils Relative: 0.6 %
Eosinophils Absolute: 258 {cells}/uL (ref 15–500)
Eosinophils Relative: 5.6 %
HCT: 40 % (ref 35.0–45.0)
Hemoglobin: 13.8 g/dL (ref 11.7–15.5)
MCH: 34.2 pg — ABNORMAL HIGH (ref 27.0–33.0)
MCHC: 34.5 g/dL (ref 32.0–36.0)
MCV: 99 fL (ref 80.0–100.0)
MPV: 10 fL (ref 7.5–12.5)
Monocytes Relative: 7.3 %
Neutro Abs: 2401 {cells}/uL (ref 1500–7800)
Neutrophils Relative %: 52.2 %
Platelets: 269 10*3/uL (ref 140–400)
RBC: 4.04 10*6/uL (ref 3.80–5.10)
RDW: 12 % (ref 11.0–15.0)
Total Lymphocyte: 34.3 %
WBC: 4.6 10*3/uL (ref 3.8–10.8)

## 2023-08-10 LAB — LIPID PANEL
Cholesterol: 151 mg/dL (ref <200)
HDL: 50 mg/dL (ref >=50)
LDL Cholesterol (Calc): 88 mg/dL
Non-HDL Cholesterol (Calc): 101 mg/dL (ref <130)
Total CHOL/HDL Ratio: 3 (calc) (ref <5.0)
Triglycerides: 45 mg/dL (ref <150)

## 2023-08-10 LAB — HEMOGLOBIN A1C
Hgb A1c MFr Bld: 5.9 %{Hb} — ABNORMAL HIGH (ref <5.7)
Mean Plasma Glucose: 123 mg/dL
eAG (mmol/L): 6.8 mmol/L

## 2023-08-10 LAB — HEPATITIS C ANTIBODY: Hepatitis C Ab: NONREACTIVE

## 2023-08-19 NOTE — Progress Notes (Signed)
-    no anemia -  AST and ALT (liver enzymes) elevated, can be rechecked on next visit -  A1C Prediabetic -  Hep C antibody negative -  lipid panel normal

## 2023-08-30 ENCOUNTER — Ambulatory Visit (INDEPENDENT_AMBULATORY_CARE_PROVIDER_SITE_OTHER): Admitting: Adult Health

## 2023-08-30 ENCOUNTER — Encounter: Payer: Self-pay | Admitting: Adult Health

## 2023-08-30 VITALS — BP 100/80 | HR 97 | Temp 96.2°F | Resp 98 | Ht 65.0 in | Wt 115.4 lb

## 2023-08-30 DIAGNOSIS — R7303 Prediabetes: Secondary | ICD-10-CM

## 2023-08-30 DIAGNOSIS — F419 Anxiety disorder, unspecified: Secondary | ICD-10-CM | POA: Diagnosis not present

## 2023-08-30 DIAGNOSIS — R748 Abnormal levels of other serum enzymes: Secondary | ICD-10-CM | POA: Diagnosis not present

## 2023-08-30 LAB — HEPATIC FUNCTION PANEL
AG Ratio: 1.9 (calc) (ref 1.0–2.5)
ALT: 36 U/L — ABNORMAL HIGH (ref 6–29)
AST: 32 U/L — ABNORMAL HIGH (ref 10–30)
Albumin: 4.7 g/dL (ref 3.6–5.1)
Alkaline phosphatase (APISO): 55 U/L (ref 31–125)
Bilirubin, Direct: 0.2 mg/dL (ref 0.0–0.2)
Globulin: 2.5 g/dL (ref 1.9–3.7)
Indirect Bilirubin: 0.5 mg/dL (ref 0.2–1.2)
Total Bilirubin: 0.7 mg/dL (ref 0.2–1.2)
Total Protein: 7.2 g/dL (ref 6.1–8.1)

## 2023-08-30 NOTE — Progress Notes (Signed)
 [facility] clinic  Provider:  Inge Mangle DNP  Code Status:  Full Code  Goals of Care:     08/09/2023    3:09 PM  Advanced Directives  Does Patient Have a Medical Advance Directive? No  Would patient like information on creating a medical advance directive? No - Patient declined     Chief Complaint  Patient presents with   Medical Management of Chronic Issues    New patient 3 week follow up. Discuss the need for Covid Booster, Pne vaccine, and Pap smear.    Discussed the use of AI scribe software for clinical note transcription with the patient, who gave verbal consent to proceed.  HPI: Patient is a 43 y.o. female seen today for a 3-week follow up of chronic medical issues.  She has chronic hepatitis B, which has been stable with normal liver enzyme levels until recent lab work showed elevated liver enzymes. No abdominal pain, jaundice, or other new symptoms. She denies alcohol consumption and does not take acetaminophen , but occasionally uses ibuprofen  for headaches.  She is managing prediabetes, identified last year with an A1C close to 7.0. She has been working on her diet, reducing starch intake, and engaging in exercise. Recent lab results show improvement in her A1C levels.  She experiences anxiety related to driving, causing dizziness and confusion. She avoids driving long distances and prefers walking or getting rides from others. She has driven short distances recently but remains apprehensive about longer distances.  She is currently attending school, having completed her clinicals, and attends classes two days a week. She works part-time and plans to increase her work hours soon. 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  No recent weight changes, swelling, or other systemic symptoms.    Past Medical History:  Diagnosis Date   Anxiety    Chronic hepatitis B without delta agent without cirrhosis (HCC) 06/03/2015   High-risk pregnancy  06/03/2015   Vaginal bleeding during pregnancy, antepartum 09/23/2015    No past surgical history on file.  No Known Allergies  Outpatient Encounter Medications as of 08/30/2023  Medication Sig   ibuprofen  (ADVIL ,MOTRIN ) 600 MG tablet Take 1 tablet (600 mg total) by mouth every 6 (six) hours. (Patient not taking: Reported on 08/30/2023)   Prenatal Vit-Fe Fumarate-FA (PRENATAL VITAMIN PLUS LOW IRON) 27-1 MG TABS TAKE 1 TABLET BY MOUTH EVERY DAY (Patient not taking: Reported on 08/30/2023)   No facility-administered encounter medications on file as of 08/30/2023.    Review of Systems:  Review of Systems  Constitutional:  Negative for appetite change, chills, fatigue and fever.  HENT:  Negative for congestion, hearing loss, rhinorrhea and sore throat.   Eyes: Negative.   Respiratory:  Negative for cough, shortness of breath and wheezing.   Cardiovascular:  Negative for chest pain, palpitations and leg swelling.  Gastrointestinal:  Negative for abdominal pain, constipation, diarrhea, nausea and vomiting.  Genitourinary:  Negative for dysuria.  Musculoskeletal:  Negative for arthralgias, back pain and myalgias.  Skin:  Negative for color change, rash and wound.  Neurological:  Negative for dizziness, weakness and headaches.  Psychiatric/Behavioral:  Negative for behavioral problems. The patient is nervous/anxious.     Health Maintenance  Topic Date Due   Pneumococcal Vaccine 71-84 Years old (1 of 2 - PCV) Never done   Cervical Cancer Screening (HPV/Pap Cotest)  Never done   COVID-19 Vaccine (3 - 2024-25 season) 01/10/2023   INFLUENZA VACCINE  12/10/2023   DTaP/Tdap/Td (6 -  Td or Tdap) 07/30/2027   Hepatitis C Screening  Completed   HIV Screening  Completed   HPV VACCINES  Aged Out   Meningococcal B Vaccine  Aged Out    Physical Exam: Vitals:   08/30/23 1012  BP: 100/80  Pulse: 97  Resp: (!) 98  Temp: (!) 96.2 F (35.7 C)  Weight: 115 lb 6.4 oz (52.3 kg)  Height: 5\' 5"   (1.651 m)  HC: 16" (40.6 cm)   Body mass index is 19.2 kg/m. Physical Exam Constitutional:      Appearance: Normal appearance.  HENT:     Head: Normocephalic and atraumatic.     Nose: Nose normal.     Mouth/Throat:     Mouth: Mucous membranes are moist.  Eyes:     Conjunctiva/sclera: Conjunctivae normal.  Cardiovascular:     Rate and Rhythm: Normal rate and regular rhythm.  Pulmonary:     Effort: Pulmonary effort is normal.     Breath sounds: Normal breath sounds.  Abdominal:     General: Bowel sounds are normal.     Palpations: Abdomen is soft.  Musculoskeletal:        General: Normal range of motion.     Cervical back: Normal range of motion.  Skin:    General: Skin is warm and dry.  Neurological:     General: No focal deficit present.     Mental Status: She is alert and oriented to person, place, and time.  Psychiatric:        Mood and Affect: Mood normal.        Behavior: Behavior normal.        Thought Content: Thought content normal.        Judgment: Judgment normal.     Labs reviewed: Basic Metabolic Panel: Recent Labs    08/09/23 1551  NA 138  K 4.6  CL 105  CO2 26  GLUCOSE 102  BUN 10  CREATININE 0.60  CALCIUM 9.6   Liver Function Tests: Recent Labs    08/09/23 1551 08/30/23 1032  AST 122* 32*  ALT 47* 36*  BILITOT 0.4 0.7  PROT 7.0 7.2   No results for input(s): "LIPASE", "AMYLASE" in the last 8760 hours. No results for input(s): "AMMONIA" in the last 8760 hours. CBC: Recent Labs    08/09/23 1551  WBC 4.6  NEUTROABS 2,401  HGB 13.8  HCT 40.0  MCV 99.0  PLT 269   Lipid Panel: Recent Labs    08/09/23 1551  CHOL 151  HDL 50  LDLCALC 88  TRIG 45  CHOLHDL 3.0   Lab Results  Component Value Date   HGBA1C 5.9 (H) 08/09/2023    Procedures since last visit: No results found.  Assessment/Plan  1. Elevated liver enzymes (Primary) -  Chronic hepatitis B with first instance of elevated liver enzymes. No symptoms or  hepatotoxic factors reported. - Repeat liver enzyme test. - Hepatic Function Panel  2. Prediabetes Lab Results  Component Value Date   HGBA1C 5.9 (H) 08/09/2023    Previously elevated HbA1c indicating prediabetes. Improved with diet and exercise.  3. Anxiety -  Anxiety when driving causing dizziness and confusion. Encouraged gradual exposure to driving.      Labs/tests ordered:   Liver panel   Return in about 6 months (around 02/29/2024).  Shaneal Barasch Medina-Vargas, NP

## 2023-08-31 NOTE — Progress Notes (Signed)
-    AST and ALT trended down, will re-check in next follow up (3 months).

## 2023-12-28 ENCOUNTER — Other Ambulatory Visit: Payer: Self-pay | Admitting: Adult Health

## 2023-12-28 DIAGNOSIS — Z1231 Encounter for screening mammogram for malignant neoplasm of breast: Secondary | ICD-10-CM

## 2024-01-12 ENCOUNTER — Ambulatory Visit
Admission: RE | Admit: 2024-01-12 | Discharge: 2024-01-12 | Disposition: A | Discharge status: Home / Self Care | Source: Ambulatory Visit

## 2024-01-12 DIAGNOSIS — Z1231 Encounter for screening mammogram for malignant neoplasm of breast: Secondary | ICD-10-CM

## 2024-01-18 ENCOUNTER — Ambulatory Visit: Payer: Self-pay | Admitting: Adult Health

## 2024-01-18 NOTE — Progress Notes (Signed)
-    mammogram negative for malignancy

## 2024-05-15 ENCOUNTER — Encounter: Payer: Self-pay | Admitting: Obstetrics and Gynecology

## 2024-05-15 ENCOUNTER — Ambulatory Visit (INDEPENDENT_AMBULATORY_CARE_PROVIDER_SITE_OTHER): Admitting: Obstetrics and Gynecology

## 2024-05-15 ENCOUNTER — Other Ambulatory Visit: Payer: Self-pay

## 2024-05-15 ENCOUNTER — Other Ambulatory Visit (HOSPITAL_COMMUNITY)
Admission: RE | Admit: 2024-05-15 | Discharge: 2024-05-15 | Disposition: A | Discharge status: Home / Self Care | Source: Ambulatory Visit | Attending: Family Medicine | Admitting: Family Medicine

## 2024-05-15 VITALS — BP 124/91 | HR 76 | Ht 65.0 in | Wt 115.8 lb

## 2024-05-15 DIAGNOSIS — Z01419 Encounter for gynecological examination (general) (routine) without abnormal findings: Secondary | ICD-10-CM

## 2024-05-15 DIAGNOSIS — Z30433 Encounter for removal and reinsertion of intrauterine contraceptive device: Secondary | ICD-10-CM | POA: Diagnosis not present

## 2024-05-15 DIAGNOSIS — Z124 Encounter for screening for malignant neoplasm of cervix: Secondary | ICD-10-CM | POA: Insufficient documentation

## 2024-05-15 DIAGNOSIS — Z3040 Encounter for surveillance of contraceptives, unspecified: Secondary | ICD-10-CM

## 2024-05-15 DIAGNOSIS — Z113 Encounter for screening for infections with a predominantly sexual mode of transmission: Secondary | ICD-10-CM

## 2024-05-15 MED ORDER — LEVONORGESTREL 20 MCG/DAY IU IUD
1.0000 | INTRAUTERINE_SYSTEM | Freq: Once | INTRAUTERINE | Status: AC
  Administered 2024-05-15: 1 via INTRAUTERINE

## 2024-05-15 NOTE — Progress Notes (Signed)
 Pt did not get labs at last visit,after 5p, please draw labs for STD screening

## 2024-05-15 NOTE — Progress Notes (Signed)
 "   GYNECOLOGY ANNUAL PREVENTATIVE CARE ENCOUNTER NOTE  History:     Vicki Allen is a 44 y.o. G42P2001 female here for a routine annual gynecologic exam.  Current complaints: desires IUD removed and replaced.   Denies abnormal vaginal bleeding, discharge, pelvic pain, problems with intercourse or other gynecologic concerns.    Gynecologic History No LMP recorded. (Menstrual status: IUD). Contraception: IUD Last Pap: unknown Last mammogram: 01/12/24. Results were: normal  Obstetric History OB History  Gravida Para Term Preterm AB Living  3 2 2   1   SAB IAB Ectopic Multiple Live Births     0 1    # Outcome Date GA Lbr Len/2nd Weight Sex Type Anes PTL Lv  3 Gravida           2 Term 09/28/15 [redacted]w[redacted]d 08:35 7 lb 3.5 oz (3.275 kg) F Vag-Spont None N LIV  1 Term             Past Medical History:  Diagnosis Date   Anxiety    Chronic hepatitis B without delta agent without cirrhosis (HCC) 06/03/2015   High-risk pregnancy 06/03/2015   Vaginal bleeding during pregnancy, antepartum 09/23/2015    No past surgical history on file.  Medications Ordered Prior to Encounter[1]  Allergies[2]  Social History:  reports that she has never smoked. She has never used smokeless tobacco. She reports that she does not drink alcohol and does not use drugs.  Family History  Problem Relation Age of Onset   Hypertension Mother    Diabetes Mother    Asthma Father    Cancer Father    Breast cancer Neg Hx     The following portions of the patient's history were reviewed and updated as appropriate: allergies, current medications, past family history, past medical history, past social history, past surgical history and problem list.  Review of Systems Pertinent items noted in HPI and remainder of comprehensive ROS otherwise negative.  Physical Exam:  BP (!) 124/91   Pulse 76   Ht 5' 5 (1.651 m)   Wt 115 lb 12.8 oz (52.5 kg)   BMI 19.27 kg/m  CONSTITUTIONAL: Well-developed, well-nourished  female in no acute distress.  HENT:  Normocephalic, atraumatic, External right and left ear normal. Oropharynx is clear and moist EYES: Conjunctivae and EOM are normal. NECK: Normal range of motion, supple, no masses.  Normal thyroid.  SKIN: Skin is warm and dry. No rash noted. Not diaphoretic. No erythema. No pallor. MUSCULOSKELETAL: Normal range of motion. No tenderness.  No cyanosis, clubbing, or edema.  2+ distal pulses. NEUROLOGIC: Alert and oriented to person, place, and time. Normal reflexes, muscle tone coordination.  PSYCHIATRIC: Normal mood and affect. Normal behavior. Normal judgment and thought content. CARDIOVASCULAR: Normal heart rate noted, regular rhythm RESPIRATORY: Clear to auscultation bilaterally. Effort and breath sounds normal, no problems with respiration noted. BREASTS: Symmetric in size. No masses, tenderness, skin changes, nipple drainage, or lymphadenopathy bilaterally. Performed in the presence of a chaperone. ABDOMEN: Soft, no distention noted.  No tenderness, rebound or guarding.  PELVIC: Normal appearing external genitalia and urethral meatus; normal appearing vaginal mucosa and cervix.  No abnormal discharge noted.  Pap smear obtained.  Normal uterine size, no other palpable masses, no uterine or adnexal tenderness.  Performed in the presence of a chaperone.   Assessment and Plan:    1. Cervical cancer screening (Primary)  - Cytology - PAP( [facility])  2. Screening for STD (sexually transmitted disease) Per pt request  -  HIV Antibody (routine testing w rflx) - RPR W/RFLX TO RPR TITER, TREPONEMAL AB, SCREEN AND DIAGNOSIS - Hepatitis B Surface AntiGEN - Hepatitis C Antibody  3. Women's annual routine gynecological examination Normal annual exam  4. Encounter for surveillance of contraceptive device See separate note. - levonorgestrel  (MIRENA ) 20 MCG/DAY IUD 1 each  Will follow up results of pap smear and manage accordingly. Routine preventative  health maintenance measures emphasized. Please refer to After Visit Summary for other counseling recommendations.      Jerilynn Buddle, MD, FACOG Obstetrician & Gynecologist, [facility] for [facility], [facility]     [1]  No current outpatient medications on file prior to visit.   No current facility-administered medications on file prior to visit.  [2] No Known Allergies  "

## 2024-05-15 NOTE — Progress Notes (Signed)
" ° ° °  GYNECOLOGY OFFICE PROCEDURE NOTE  Vicki Allen is a 44 y.o. G3P2001 here for  IUD removal. No GYN concerns.  Last pap smear was on done today.  IUD Removal  Patient identified, informed consent performed, consent signed.  Patient was in the dorsal lithotomy position, normal external genitalia was noted.  A speculum was placed in the patient's vagina, normal discharge was noted, no lesions. The cervix was visualized, no lesions, no abnormal discharge.  The strings of the IUD were grasped and pulled using ring forceps. The IUD was removed in its entirety.  Patient tolerated the procedure well.    Patient will use new IUD for contraception.  Routine preventative health maintenance measures emphasized.    GYNECOLOGY OFFICE PROCEDURE NOTE  Vicki Allen is a 44 y.o. G3P2001 here for mirena  IUD insertion. No GYN concerns.    IUD Insertion Procedure Note Patient identified, informed consent performed, consent signed.   Discussed risks of irregular bleeding, cramping, infection, malpositioning or misplacement of the IUD outside the uterus which may require further procedure such as laparoscopy. Also discussed >99% contraception efficacy, increased risk of ectopic pregnancy with failure of method.  Time out was performed.  Urine pregnancy test negative.  Speculum placed in the vagina.  Cervix visualized.  Cleaned with Betadine x 2.  Grasped anteriorly with a single tooth tenaculum.  Uterus sounded to 9.8 cm.  Mirena  IUD placed per manufacturer's recommendations.  Strings trimmed to 3 cm.  IUD then appeared to be sitting at cervical os.  Displaced IUD removed and new Mirena  placed with same technique. Tenaculum was removed, good hemostasis noted.  Patient tolerated procedure well.   Patient was given post-procedure instructions.  She was advised to have backup contraception for one week.  Patient was also asked to check IUD strings periodically and follow up in 4 weeks for IUD  check.   Jerilynn Buddle, MD, FACOG Obstetrician & Gynecologist, [facility] for [facility], [facility]  "

## 2024-05-18 LAB — CYTOLOGY - PAP
Chlamydia: NEGATIVE
Comment: NEGATIVE
Comment: NEGATIVE
Comment: NEGATIVE
Comment: NORMAL
Diagnosis: NEGATIVE
High risk HPV: NEGATIVE
Neisseria Gonorrhea: NEGATIVE
Trichomonas: NEGATIVE

## 2024-05-19 ENCOUNTER — Ambulatory Visit: Payer: Self-pay | Admitting: Obstetrics and Gynecology

## 2024-06-20 ENCOUNTER — Ambulatory Visit: Payer: Self-pay | Admitting: Advanced Practice Midwife
